# Patient Record
Sex: Female | Born: 2000 | Race: Black or African American | Hispanic: No | Smoking: Never smoker
Health system: Southern US, Community
[De-identification: ages and names within clinical notes are randomized; demographics above are authoritative.]

---
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
## 2021-01-08 ENCOUNTER — Other Ambulatory Visit: Payer: Self-pay

## 2021-01-08 ENCOUNTER — Encounter (HOSPITAL_BASED_OUTPATIENT_CLINIC_OR_DEPARTMENT_OTHER): Payer: Self-pay

## 2021-01-08 ENCOUNTER — Emergency Department (HOSPITAL_BASED_OUTPATIENT_CLINIC_OR_DEPARTMENT_OTHER)
Admission: EM | Admit: 2021-01-08 | Discharge: 2021-01-08 | Disposition: A | Discharge status: Home / Self Care | Payer: PRIVATE HEALTH INSURANCE | Attending: Emergency Medicine | Admitting: Emergency Medicine

## 2021-01-08 DIAGNOSIS — R112 Nausea with vomiting, unspecified: Secondary | ICD-10-CM

## 2021-01-08 DIAGNOSIS — R1084 Generalized abdominal pain: Secondary | ICD-10-CM | POA: Diagnosis present

## 2021-01-08 DIAGNOSIS — R1110 Vomiting, unspecified: Secondary | ICD-10-CM | POA: Insufficient documentation

## 2021-01-08 LAB — CBC WITH DIFFERENTIAL/PLATELET
Abs Immature Granulocytes: 0.05 10*3/uL (ref 0.00–0.07)
Basophils Absolute: 0 10*3/uL (ref 0.0–0.1)
Basophils Relative: 0 %
Eosinophils Absolute: 0 10*3/uL (ref 0.0–0.5)
Eosinophils Relative: 0 %
HCT: 44.6 % (ref 36.0–46.0)
Hemoglobin: 14.5 g/dL (ref 12.0–15.0)
Immature Granulocytes: 0 %
Lymphocytes Relative: 11 %
Lymphs Abs: 1.4 10*3/uL (ref 0.7–4.0)
MCH: 28.2 pg (ref 26.0–34.0)
MCHC: 32.5 g/dL (ref 30.0–36.0)
MCV: 86.6 fL (ref 80.0–100.0)
Monocytes Absolute: 0.2 10*3/uL (ref 0.1–1.0)
Monocytes Relative: 2 %
Neutro Abs: 10.7 10*3/uL — ABNORMAL HIGH (ref 1.7–7.7)
Neutrophils Relative %: 87 %
Platelets: 333 10*3/uL (ref 150–400)
RBC: 5.15 MIL/uL — ABNORMAL HIGH (ref 3.87–5.11)
RDW: 12.5 % (ref 11.5–15.5)
WBC: 12.4 10*3/uL — ABNORMAL HIGH (ref 4.0–10.5)
nRBC: 0 % (ref 0.0–0.2)

## 2021-01-08 LAB — COMPREHENSIVE METABOLIC PANEL
ALT: 24 U/L (ref 0–44)
AST: 38 U/L (ref 15–41)
Albumin: 4.6 g/dL (ref 3.5–5.0)
Alkaline Phosphatase: 56 U/L (ref 38–126)
Anion gap: 15 (ref 5–15)
BUN: 13 mg/dL (ref 6–20)
CO2: 19 mmol/L — ABNORMAL LOW (ref 22–32)
Calcium: 9.6 mg/dL (ref 8.9–10.3)
Chloride: 106 mmol/L (ref 98–111)
Creatinine, Ser: 0.8 mg/dL (ref 0.44–1.00)
GFR, Estimated: >60 mL/min (ref >60)
Glucose, Bld: 109 mg/dL — ABNORMAL HIGH (ref 70–99)
Potassium: 3.9 mmol/L (ref 3.5–5.1)
Sodium: 140 mmol/L (ref 135–145)
Total Bilirubin: 0.7 mg/dL (ref 0.3–1.2)
Total Protein: 8.5 g/dL — ABNORMAL HIGH (ref 6.5–8.1)

## 2021-01-08 LAB — URINALYSIS, MICROSCOPIC (REFLEX): WBC, UA: NONE SEEN WBC/hpf (ref 0–5)

## 2021-01-08 LAB — PREGNANCY, URINE: Preg Test, Ur: NEGATIVE

## 2021-01-08 LAB — URINALYSIS, ROUTINE W REFLEX MICROSCOPIC
Bilirubin Urine: NEGATIVE
Glucose, UA: NEGATIVE mg/dL
Hgb urine dipstick: NEGATIVE
Ketones, ur: 40 mg/dL — AB
Leukocytes,Ua: NEGATIVE
Nitrite: NEGATIVE
Protein, ur: 100 mg/dL — AB
Specific Gravity, Urine: >1.03 — ABNORMAL HIGH (ref 1.005–1.030)
pH: 6.5 (ref 5.0–8.0)

## 2021-01-08 LAB — LIPASE, BLOOD: Lipase: 30 U/L (ref 11–51)

## 2021-01-08 MED ORDER — ONDANSETRON HCL 4 MG PO TABS: 4.0000 mg | ORAL_TABLET | Freq: Four times a day (QID) | ORAL | 0 refills | Status: DC

## 2021-01-08 MED ORDER — ONDANSETRON HCL 4 MG/2ML IJ SOLN
4.0000 mg | Freq: Once | INTRAMUSCULAR | Status: AC
  Administered 2021-01-08: 4 mg via INTRAVENOUS
  Filled 2021-01-08: qty 2

## 2021-01-08 MED ORDER — DICYCLOMINE HCL 10 MG/ML IM SOLN
20.0000 mg | Freq: Once | INTRAMUSCULAR | Status: AC
  Administered 2021-01-08: 20 mg via INTRAMUSCULAR
  Filled 2021-01-08: qty 2

## 2021-01-08 MED ORDER — SODIUM CHLORIDE 0.9 % IV BOLUS
1000.0000 mL | Freq: Once | INTRAVENOUS | Status: AC
  Administered 2021-01-08: 1000 mL via INTRAVENOUS

## 2021-01-08 NOTE — ED Notes (Signed)
D/c paperwork reviewed with pt, including prescription.  All questions addressed prior to d/c. Pt accompanied by visitor to ED exit. NAD.

## 2021-01-08 NOTE — Discharge Instructions (Addendum)
Take Zofran every 6 hours as needed for nausea and vomiting.  You take Tylenol Motrin for abdominal pain.  Follow-up with your primary care doctor if you are still having symptoms in a week.  I suspect you are just dehydrated from the alcohol, I would advise abstaining from alcohol.

## 2021-01-08 NOTE — ED Triage Notes (Addendum)
Pt arrives with c/o vomiting and central abdominal pain starting this morning. No other symptoms.   After triage pt fiance reports she drank 6-7 shots last night.

## 2021-01-08 NOTE — ED Notes (Signed)
IV attempt x2 unsuccessful, RN aware.

## 2021-01-08 NOTE — ED Provider Notes (Signed)
[facility] EMERGENCY DEPARTMENT Provider Note   CSN: [id number] Arrival date & time: 01/08/21  1510     History Chief Complaint  Patient presents with   Abdominal Pain   Emesis    Judy Daniel is a 20 y.o. female.   Abdominal Pain Associated symptoms: diarrhea and vomiting   Associated symptoms: no chest pain, no dysuria, no fever and no shortness of breath   Emesis Associated symptoms: abdominal pain and diarrhea   Associated symptoms: no fever    Patient presents with epigastric pain and emesis.  She went out drinking last night and had 6 or 7 shots, woke up this morning having diffuse abdominal pain which is worse in the epigastric area.  Its been constant, aggravated by vomiting.  Denies any hematemesis, not having any dysuria or hematuria.  Denies any previous abdominal surgeries.  Has not tried any over-the-counter medicines.   History reviewed. No pertinent past medical history.  There are no problems to display for this patient.   History reviewed. No pertinent surgical history.   OB History   No obstetric history on file.     No family history on file.  Social History   Tobacco Use   Smoking status: Never   Smokeless tobacco: Never  Vaping Use   Vaping Use: Every day  Substance Use Topics   Alcohol use: Yes    Comment: pt reports drinking 6-7 shots last night   Drug use: Yes    Types: Marijuana    Comment: daily    Home Medications Prior to Admission medications   Medication Sig Start Date End Date Taking? Authorizing Provider  COVID-19 Ad26 vaccine, JANSSEN/J&J, 0.5 ML injection Inject into the muscle. 11/10/20   Judyann Munson, MD    Allergies    Patient has no known allergies.  Review of Systems   Review of Systems  Constitutional:  Negative for fever.  Respiratory:  Negative for shortness of breath.   Cardiovascular:  Negative for chest pain.  Gastrointestinal:  Positive for abdominal pain, diarrhea and  vomiting.  Genitourinary:  Negative for dysuria and flank pain.  Musculoskeletal:  Negative for back pain.   Physical Exam Updated Vital Signs BP 105/65 (BP Location: Left Arm)   Pulse (!) 110   Temp 97.6 F (36.4 C) (Oral)   Resp 15   Ht 5\' 2"  (1.575 m)   Wt 65.8 kg   LMP 12/08/2020   SpO2 100%   BMI 26.52 kg/m   Physical Exam Vitals and nursing note reviewed.  Constitutional:      General: She is not in acute distress.    Appearance: She is well-developed.  HENT:     Head: Normocephalic and atraumatic.  Eyes:     Conjunctiva/sclera: Conjunctivae normal.  Cardiovascular:     Rate and Rhythm: Normal rate and regular rhythm.     Heart sounds: No murmur heard. Pulmonary:     Effort: Pulmonary effort is normal. No respiratory distress.     Breath sounds: Normal breath sounds.  Abdominal:     Palpations: Abdomen is soft.     Tenderness: There is abdominal tenderness in the epigastric area. There is no guarding or rebound.  Musculoskeletal:        General: No swelling.     Cervical back: Neck supple.  Skin:    General: Skin is warm and dry.     Capillary Refill: Capillary refill takes less than 2 seconds.  Neurological:     Mental  Status: She is alert.  Psychiatric:        Mood and Affect: Mood normal.   ED Results / Procedures / Treatments   Labs (all labs ordered are listed, but only abnormal results are displayed) Labs Reviewed  CBC WITH DIFFERENTIAL/PLATELET - Abnormal; Notable for the following components:      Result Value   WBC 12.4 (*)    RBC 5.15 (*)    Neutro Abs 10.7 (*)    All other components within normal limits  PREGNANCY, URINE  COMPREHENSIVE METABOLIC PANEL  LIPASE, BLOOD  URINALYSIS, ROUTINE W REFLEX MICROSCOPIC    EKG None  Radiology No results found.  Procedures Procedures   Medications Ordered in ED Medications  sodium chloride 0.9 % bolus 1,000 mL (has no administration in time range)  ondansetron (ZOFRAN) injection 4 mg (has  no administration in time range)  dicyclomine (BENTYL) injection 20 mg (has no administration in time range)    ED Course  I have reviewed the triage vital signs and the nursing notes.  Pertinent labs & imaging results that were available during my care of the patient were reviewed by me and considered in my medical decision making (see chart for details).  Clinical Course as of 01/08/21 1644  Fri Jan 08, 2021  1616 Lipase, blood Lipase within normal limits, although she is having epigastric pain and nausea and vomiting suspect likely secondary to ketosis from from the alcohol/emesis and not pancreatitis. [HS]  [id number] CBC with Differential(!) And not slight leukocytosis likely reactionary, no anemia.  [HS]  [id number] Comprehensive metabolic panel(!) No gross electrolyte, AKI or elevated LFT pattern concerning for biliary process.  [HS]  [id number] Urine is negative for UTI, additionally she is not pregnant.  There is ketonuria and proteinuria consistent with her emesis. [HS]    Clinical Course User Index [HS] Sherrill Raring, PA-C   MDM Rules/Calculators/A&P                           Stable vitals, nontoxic-appearing.  Diffuse abdominal tenderness, not specifically well localized.  No peritoneal signs, doubt surgical abdomen.  Is a consideration this could be an early appendicitis, however given her presentation and history of recent alcohol intake I think that is less likely.  Lab work as a pertains to the differential is in the ED course.  Will give fluids, Bentyl, Zofran and discharge patient.  Advised to return if she starts having right lower quadrant pain.  Patient discharged in stable condition.  Final Clinical Impression(s) / ED Diagnoses Final diagnoses:  None    Rx / DC Orders ED Discharge Orders     None        Sherrill Raring, [place] 01/08/21 1828    Lucrezia Starch, MD 01/09/21 1919

## 2021-04-01 ENCOUNTER — Emergency Department (HOSPITAL_BASED_OUTPATIENT_CLINIC_OR_DEPARTMENT_OTHER)
Admission: EM | Admit: 2021-04-01 | Discharge: 2021-04-01 | Disposition: A | Discharge status: Home / Self Care | Payer: Medicaid Other | Attending: Emergency Medicine | Admitting: Emergency Medicine

## 2021-04-01 ENCOUNTER — Other Ambulatory Visit: Payer: Self-pay

## 2021-04-01 ENCOUNTER — Emergency Department (HOSPITAL_BASED_OUTPATIENT_CLINIC_OR_DEPARTMENT_OTHER): Payer: Medicaid Other

## 2021-04-01 ENCOUNTER — Encounter (HOSPITAL_BASED_OUTPATIENT_CLINIC_OR_DEPARTMENT_OTHER): Payer: Self-pay | Admitting: *Deleted

## 2021-04-01 DIAGNOSIS — R1084 Generalized abdominal pain: Secondary | ICD-10-CM | POA: Diagnosis present

## 2021-04-01 DIAGNOSIS — Z79899 Other long term (current) drug therapy: Secondary | ICD-10-CM | POA: Diagnosis not present

## 2021-04-01 DIAGNOSIS — N3090 Cystitis, unspecified without hematuria: Secondary | ICD-10-CM | POA: Diagnosis not present

## 2021-04-01 LAB — COMPREHENSIVE METABOLIC PANEL
ALT: 16 U/L (ref 0–44)
AST: 29 U/L (ref 15–41)
Albumin: 4.9 g/dL (ref 3.5–5.0)
Alkaline Phosphatase: 57 U/L (ref 38–126)
Anion gap: 15 (ref 5–15)
BUN: 11 mg/dL (ref 6–20)
CO2: 21 mmol/L — ABNORMAL LOW (ref 22–32)
Calcium: 10 mg/dL (ref 8.9–10.3)
Chloride: 101 mmol/L (ref 98–111)
Creatinine, Ser: 0.95 mg/dL (ref 0.44–1.00)
GFR, Estimated: >60 mL/min (ref >60)
Glucose, Bld: 116 mg/dL — ABNORMAL HIGH (ref 70–99)
Potassium: 4.1 mmol/L (ref 3.5–5.1)
Sodium: 137 mmol/L (ref 135–145)
Total Bilirubin: 0.9 mg/dL (ref 0.3–1.2)
Total Protein: 9 g/dL — ABNORMAL HIGH (ref 6.5–8.1)

## 2021-04-01 LAB — CBC
HCT: 46.4 % — ABNORMAL HIGH (ref 36.0–46.0)
Hemoglobin: 15.6 g/dL — ABNORMAL HIGH (ref 12.0–15.0)
MCH: 29.5 pg (ref 26.0–34.0)
MCHC: 33.6 g/dL (ref 30.0–36.0)
MCV: 87.7 fL (ref 80.0–100.0)
Platelets: 392 10*3/uL (ref 150–400)
RBC: 5.29 MIL/uL — ABNORMAL HIGH (ref 3.87–5.11)
RDW: 12.1 % (ref 11.5–15.5)
WBC: 7.3 10*3/uL (ref 4.0–10.5)
nRBC: 0 % (ref 0.0–0.2)

## 2021-04-01 LAB — URINALYSIS, ROUTINE W REFLEX MICROSCOPIC
Bilirubin Urine: NEGATIVE
Glucose, UA: NEGATIVE mg/dL
Ketones, ur: 80 mg/dL — AB
Nitrite: NEGATIVE
Protein, ur: 30 mg/dL — AB
Specific Gravity, Urine: 1.02 (ref 1.005–1.030)
pH: 8.5 — ABNORMAL HIGH (ref 5.0–8.0)

## 2021-04-01 LAB — URINALYSIS, MICROSCOPIC (REFLEX): WBC, UA: >50 WBC/hpf (ref 0–5)

## 2021-04-01 LAB — WET PREP, GENITAL
Clue Cells Wet Prep HPF POC: NONE SEEN
Sperm: NONE SEEN
Trich, Wet Prep: NONE SEEN
WBC, Wet Prep HPF POC: >=10 — AB (ref <10)
Yeast Wet Prep HPF POC: NONE SEEN

## 2021-04-01 LAB — PREGNANCY, URINE: Preg Test, Ur: NEGATIVE

## 2021-04-01 LAB — LIPASE, BLOOD: Lipase: 39 U/L (ref 11–51)

## 2021-04-01 MED ORDER — CEPHALEXIN 500 MG PO CAPS: 500.0000 mg | ORAL_CAPSULE | Freq: Two times a day (BID) | ORAL | 0 refills | Status: AC

## 2021-04-01 MED ORDER — DICYCLOMINE HCL 10 MG/ML IM SOLN
20.0000 mg | Freq: Once | INTRAMUSCULAR | Status: AC
  Administered 2021-04-01: 20 mg via INTRAMUSCULAR
  Filled 2021-04-01: qty 2

## 2021-04-01 MED ORDER — ONDANSETRON HCL 8 MG PO TABS: 4.0000 mg | ORAL_TABLET | Freq: Three times a day (TID) | ORAL | 0 refills | Status: DC | PRN

## 2021-04-01 MED ORDER — HYDROMORPHONE HCL 1 MG/ML IJ SOLN
0.5000 mg | Freq: Once | INTRAMUSCULAR | Status: AC
  Administered 2021-04-01: 0.5 mg via INTRAVENOUS
  Filled 2021-04-01: qty 1

## 2021-04-01 MED ORDER — LACTATED RINGERS IV BOLUS
1000.0000 mL | Freq: Once | INTRAVENOUS | Status: AC
  Administered 2021-04-01: 1000 mL via INTRAVENOUS

## 2021-04-01 MED ORDER — IOHEXOL 300 MG/ML  SOLN
100.0000 mL | Freq: Once | INTRAMUSCULAR | Status: AC | PRN
  Administered 2021-04-01: 100 mL via INTRAVENOUS

## 2021-04-01 MED ORDER — OMEPRAZOLE 10 MG PO CPDR: 10.0000 mg | DELAYED_RELEASE_CAPSULE | Freq: Two times a day (BID) | ORAL | 0 refills | Status: DC

## 2021-04-01 MED ORDER — ONDANSETRON HCL 4 MG/2ML IJ SOLN
4.0000 mg | Freq: Once | INTRAMUSCULAR | Status: AC
  Administered 2021-04-01: 4 mg via INTRAVENOUS
  Filled 2021-04-01: qty 2

## 2021-04-01 NOTE — Discharge Instructions (Addendum)
Please return to ED with any new symptoms such as ongoing abdominal pain, vomiting blood, blood in stool ?Please call the [facility] in [place] and request an appointment.  The number is attached in this document. ?Please follow-up with your PCP in the next 7 to 10 days ?Please begin taking omeprazole, this is a proton pump inhibitor, it helps to decrease the acidity of your stomach acid.  It will help with any abdominal pain in the future.  You will take this medication twice daily for 30 days. ?I have also sent in antinausea medication.  It is called Zofran.  You may take this medication every 8 hours as needed for nausea and vomiting. ?Please begin taking your antibiotic for your UTI.  You will take this twice daily for the next 5 days. ?Please sign up for MyChart so you can follow-up on the results of your STD screen ?Please begin utilizing condoms when you are having sexual activity ?

## 2021-04-01 NOTE — ED Notes (Signed)
Called lab to add on urine culture @ 1609.  ?

## 2021-04-01 NOTE — ED Triage Notes (Addendum)
Vomiting since this am. Abdominal pain. Pain in her chest with breathing. She is hyperventilating at triage. States she feels like she might have a UTI. ?

## 2021-04-01 NOTE — ED Provider Notes (Signed)
[facility] EMERGENCY DEPARTMENT Provider Note   CSN: [id number] Arrival date & time: 04/01/21  1315     History  Chief Complaint  Patient presents with   Abdominal Pain    Judy Daniel is a 21 y.o. female with no provided medical history. Patient reports to ED for evaluation of abdominal pain. Patient states that yesterday she vomited x1, and has thrown up "all day" today since 7AM, states it is more than 10x. Patient reports there is blood in her vomit. Patient reports the pain is located in her upper abdomen and the pain is constant. She states the pain feels "cramping, stabbing". Patient denies taking any medication prior to arrival. Patient endorsing nausea, vomiting, blood in vomit x4, diarrhea, chills, sweats. Patient denies fevers. Patient states she started her period on Saturday.    Abdominal Pain Associated symptoms: chills, diarrhea, nausea, vaginal bleeding, vaginal discharge and vomiting (Blood in vomit x4)   Associated symptoms: no dysuria and no fever       Home Medications Prior to Admission medications   Medication Sig Start Date End Date Taking? Authorizing Provider  cephALEXin (KEFLEX) 500 MG capsule Take 1 capsule (500 mg total) by mouth 2 (two) times daily for 5 days. 04/01/21 04/06/21 Yes Al Decant, PA-C  omeprazole (PRILOSEC) 10 MG capsule Take 1 capsule (10 mg total) by mouth in the morning and at bedtime. 04/01/21 05/01/21 Yes Al Decant, PA-C  ondansetron (ZOFRAN) 8 MG tablet Take 0.5 tablets (4 mg total) by mouth every 8 (eight) hours as needed for nausea or vomiting. 04/01/21  Yes Delice Bison F, PA-C  COVID-19 Ad26 vaccine, JANSSEN/J&J, 0.5 ML injection Inject into the muscle. 11/10/20   Judyann Munson, MD      Allergies    Sulfanilamide    Review of Systems   Review of Systems  Constitutional:  Positive for chills. Negative for fever.  Gastrointestinal:  Positive for abdominal pain, diarrhea, nausea and  vomiting (Blood in vomit x4).  Genitourinary:  Positive for vaginal bleeding and vaginal discharge. Negative for dysuria and flank pain.  All other systems reviewed and are negative.  Physical Exam Updated Vital Signs BP 113/73    Pulse (!) 55    Temp 98.5 F (36.9 C) (Oral)    Resp 18    Ht 5\' 2"  (1.575 m)    Wt 65.8 kg    LMP 03/27/2021    SpO2 100%    BMI 26.53 kg/m  Physical Exam Vitals and nursing note reviewed. Exam conducted with a chaperone present.  Constitutional:      General: She is not in acute distress.    Appearance: She is not ill-appearing, toxic-appearing or diaphoretic.  HENT:     Head: Normocephalic and atraumatic.     Mouth/Throat:     Mouth: Mucous membranes are moist.  Eyes:     Extraocular Movements: Extraocular movements intact.     Pupils: Pupils are equal, round, and reactive to light.  Cardiovascular:     Rate and Rhythm: Normal rate and regular rhythm.  Pulmonary:     Effort: Pulmonary effort is normal.     Breath sounds: Normal breath sounds. No wheezing.  Abdominal:     General: Abdomen is flat. Bowel sounds are normal. There is no distension. There are no signs of injury.     Palpations: Abdomen is soft. There is no hepatomegaly, splenomegaly, mass or pulsatile mass.     Tenderness: There is generalized abdominal tenderness. There  is no right CVA tenderness or left CVA tenderness.  Genitourinary:    Exam position: Knee-chest position.     Pubic Area: No rash or pubic lice.      Tanner stage (genital): 5.     Labia:        Right: No rash or tenderness.        Left: No rash or tenderness.      Vagina: No signs of injury and foreign body. Vaginal discharge and bleeding present. No erythema, tenderness or prolapsed vaginal walls.     Cervix: No friability or erythema.     Uterus: Normal. Not tender.      Adnexa: Right adnexa normal and left adnexa normal.       Right: No tenderness.         Left: No tenderness.    Skin:    General: Skin is warm  and dry.     Capillary Refill: Capillary refill takes less than 2 seconds.  Neurological:     Mental Status: She is alert and oriented to person, place, and time.    ED Results / Procedures / Treatments   Labs (all labs ordered are listed, but only abnormal results are displayed) Labs Reviewed  WET PREP, GENITAL - Abnormal; Notable for the following components:      Result Value   WBC, Wet Prep HPF POC >=10 (*)    All other components within normal limits  COMPREHENSIVE METABOLIC PANEL - Abnormal; Notable for the following components:   CO2 21 (*)    Glucose, Bld 116 (*)    Total Protein 9.0 (*)    All other components within normal limits  CBC - Abnormal; Notable for the following components:   RBC 5.29 (*)    Hemoglobin 15.6 (*)    HCT 46.4 (*)    All other components within normal limits  URINALYSIS, ROUTINE W REFLEX MICROSCOPIC - Abnormal; Notable for the following components:   APPearance CLOUDY (*)    pH 8.5 (*)    Hgb urine dipstick SMALL (*)    Ketones, ur 80 (*)    Protein, ur 30 (*)    Leukocytes,Ua SMALL (*)    All other components within normal limits  URINALYSIS, MICROSCOPIC (REFLEX) - Abnormal; Notable for the following components:   Bacteria, UA MANY (*)    All other components within normal limits  URINE CULTURE  LIPASE, BLOOD  PREGNANCY, URINE  RPR  HIV ANTIBODY (ROUTINE TESTING W REFLEX)  GC/CHLAMYDIA PROBE AMP ([place]) NOT AT [facility]    EKG None  Radiology CT ABDOMEN PELVIS W CONTRAST  Result Date: 04/01/2021 CLINICAL DATA:  Abdominal pain, acute, nonlocalized EXAM: CT ABDOMEN AND PELVIS WITH CONTRAST TECHNIQUE: Multidetector CT imaging of the abdomen and pelvis was performed using the standard protocol following bolus administration of intravenous contrast. RADIATION DOSE REDUCTION: This exam was performed according to the departmental dose-optimization program which includes automated exposure control, adjustment of the mA and/or kV according to  patient size and/or use of iterative reconstruction technique. CONTRAST:  100mL OMNIPAQUE IOHEXOL 300 MG/ML  SOLN COMPARISON:  None. FINDINGS: Lower chest: No acute abnormality. Hepatobiliary: Hypodensity along the false form ligament likely represents focal fatty infiltration. No focal liver abnormality. No gallstones, gallbladder wall thickening, or pericholecystic fluid. No biliary dilatation. Pancreas: No focal lesion. Normal pancreatic contour. No surrounding inflammatory changes. No main pancreatic ductal dilatation. Normal in size without focal abnormality. Spleen: Normal in size without focal abnormality. Adrenals/Urinary Tract: No adrenal  nodule bilaterally. Bilateral kidneys enhance symmetrically. No hydronephrosis. No hydroureter. The urinary bladder is unremarkable. Stomach/Bowel: Stomach is within normal limits. No evidence of bowel wall thickening or dilatation. The appendix is not definitely identified with no inflammatory changes in the right lower quadrant to suggest acute appendicitis. Vascular/Lymphatic: No abdominal aorta or iliac aneurysm. No abdominal, pelvic, or inguinal lymphadenopathy. Reproductive: Uterus and bilateral ovaries/adnexa are unremarkable. Other: Trace free fluid with the pelvis. No intraperitoneal free gas. No organized fluid collection. Musculoskeletal: No abdominal wall hernia or abnormality. No suspicious lytic or blastic osseous lesions. No acute displaced fracture. IMPRESSION: No acute intra-abdominal or intrapelvic abnormality. Electronically Signed   By: Tish Frederickson M.D.   On: 04/01/2021 16:31    Procedures Pelvic exam  Date/Time: 04/01/2021 5:17 PM Performed by: Al Decant, PA-C Authorized by: Al Decant, PA-C  Consent: Verbal consent obtained. Written consent obtained. Risks and benefits: risks, benefits and alternatives were discussed Consent given by: patient Patient understanding: patient states understanding of the procedure being  performed Patient consent: the patient's understanding of the procedure matches consent given Patient identity confirmed: verbally with patient and arm band Time out: Immediately prior to procedure a "time out" was called to verify the correct patient, procedure, equipment, support staff and site/side marked as required. Preparation: Patient was prepped and draped in the usual sterile fashion. Local anesthesia used: no  Anesthesia: Local anesthesia used: no  Sedation: Patient sedated: no  Patient tolerance: patient tolerated the procedure well with no immediate complications      Medications Ordered in ED Medications  ondansetron (ZOFRAN) injection 4 mg (4 mg Intravenous Given 04/01/21 1458)  lactated ringers bolus 1,000 mL (0 mLs Intravenous Stopped 04/01/21 1630)  dicyclomine (BENTYL) injection 20 mg (20 mg Intramuscular Given 04/01/21 1452)  HYDROmorphone (DILAUDID) injection 0.5 mg (0.5 mg Intravenous Given 04/01/21 1629)  iohexol (OMNIPAQUE) 300 MG/ML solution 100 mL (100 mLs Intravenous Contrast Given 04/01/21 1607)    ED Course/ Medical Decision Making/ A&P                           Medical Decision Making Amount and/or Complexity of Data Reviewed Labs: ordered. Radiology: ordered.  Risk Prescription drug management.   21 year old female presents to ED for evaluation of acute onset of abdominal pain last night/this morning with associated nausea and vomiting.  Patient also reporting blood in vomit.  On examination, the patient is obviously uncomfortable.  She is unable to stay still for throughout the duration of my exam.  She is continuously writhing on the bed.    The patient is afebrile, nontachycardic, not hypoxic.  The patient has clear lung sounds bilaterally.  The patient's abdominal exam does not raise concern for acute abdomen.  Her abdomen is soft and compressible in all 4 quadrants.  She is endorsing generalized abdominal tenderness on palpation however.   The  patient adds at the end of my examination that prior to starting her period she experienced an episode of vaginal discharge that she described as clear.  Due to this, the patient is requesting STD screenings to include gonorrhea, chlamydia, wet prep, HIV, syphilis screen.  Patient states that she is in monogamous relationship with a partner known to her.  She denies any pelvic pain, fevers.  She does endorse unprotected sex recently.  Patient denies that her partner is experiencing any symptoms either.  Please see pelvic exam procedure note.  The patient we worked up utilizing  the following labs and imaging studies interpreted by me: - CMP unremarkable - Lipase unremarkable - Pregnancy test negative - CBC unremarkable, no elevated white blood cell count - Urinalysis shows small leukocytes, ketones, protein, elevated pH of urine.  Patient states that when she urinates she feels as if her vagina "throbs" at the end of her urinating.  She is not sure if it burns when she pees.  Out of an abundance of caution, I have placed this patient on Keflex twice daily for 5 days in the event of UTI.  I have sent the patient's urine out for culture. -CT abdomen pelvis with contrast was ordered due to the fact that after administration of analgesic medication of this patient she continues to have abdominal pain causing her writhing pain.  CT abdomen pelvis with contrast does not show any abnormality.  Patient treated utilizing 1000 mL bolus of lactated Ringer's, 0.5 mg Dilaudid, 4 mg Zofran, 20 mg Bentyl.  I consulted with GI due to the fact the patient stated that she was having episodes of vomiting with blood mixed in.  She stated that she had vomited 10 times which caused concern for Mallory-Weiss tear.  I consulted with Dorethea Clan of gastroenterology who stated that the patient did not have an elevated BUN or decreased hemoglobin.  Shanda Bumps stated that if the patient did not vomit while in the department she  will be suitable to treat as an outpatient utilizing 2 time daily PPI along with Carafate.  I have placed the patient on these medications as directed by gastroenterology.  At this time, patient states that she feels much better.  This patient seems stable for discharge at this time.  I have referred the patient to a gynecologist that she states that she has never had one.  I provided the patient with return precautions.  I have answered all the patient's questions to her satisfaction.  Patient is stable at this time for discharge.   Final Clinical Impression(s) / ED Diagnoses Final diagnoses:  Generalized abdominal pain  Cystitis    Rx / DC Orders ED Discharge Orders          Ordered    omeprazole (PRILOSEC) 10 MG capsule  2 times daily        04/01/21 1727    ondansetron (ZOFRAN) 8 MG tablet  Every 8 hours PRN        04/01/21 1727    cephALEXin (KEFLEX) 500 MG capsule  2 times daily        04/01/21 1727              Al Decant, PA-C 04/01/21 1739    Vanetta Mulders, MD 04/03/21 Rickey Primus

## 2021-04-02 ENCOUNTER — Emergency Department (HOSPITAL_BASED_OUTPATIENT_CLINIC_OR_DEPARTMENT_OTHER)
Admission: EM | Admit: 2021-04-02 | Discharge: 2021-04-02 | Disposition: A | Discharge status: Home / Self Care | Payer: Medicaid Other | Attending: Emergency Medicine | Admitting: Emergency Medicine

## 2021-04-02 ENCOUNTER — Other Ambulatory Visit: Payer: Self-pay

## 2021-04-02 ENCOUNTER — Encounter (HOSPITAL_BASED_OUTPATIENT_CLINIC_OR_DEPARTMENT_OTHER): Payer: Self-pay | Admitting: Emergency Medicine

## 2021-04-02 DIAGNOSIS — R109 Unspecified abdominal pain: Secondary | ICD-10-CM | POA: Diagnosis not present

## 2021-04-02 DIAGNOSIS — F1290 Cannabis use, unspecified, uncomplicated: Secondary | ICD-10-CM | POA: Diagnosis not present

## 2021-04-02 DIAGNOSIS — E860 Dehydration: Secondary | ICD-10-CM | POA: Diagnosis not present

## 2021-04-02 DIAGNOSIS — E876 Hypokalemia: Secondary | ICD-10-CM | POA: Diagnosis not present

## 2021-04-02 DIAGNOSIS — R112 Nausea with vomiting, unspecified: Secondary | ICD-10-CM

## 2021-04-02 LAB — CBC WITH DIFFERENTIAL/PLATELET
Abs Immature Granulocytes: 0.03 10*3/uL (ref 0.00–0.07)
Basophils Absolute: 0 10*3/uL (ref 0.0–0.1)
Basophils Relative: 0 %
Eosinophils Absolute: 0 10*3/uL (ref 0.0–0.5)
Eosinophils Relative: 0 %
HCT: 43.3 % (ref 36.0–46.0)
Hemoglobin: 14.7 g/dL (ref 12.0–15.0)
Immature Granulocytes: 0 %
Lymphocytes Relative: 13 %
Lymphs Abs: 1.4 10*3/uL (ref 0.7–4.0)
MCH: 29.6 pg (ref 26.0–34.0)
MCHC: 33.9 g/dL (ref 30.0–36.0)
MCV: 87.1 fL (ref 80.0–100.0)
Monocytes Absolute: 0.6 10*3/uL (ref 0.1–1.0)
Monocytes Relative: 5 %
Neutro Abs: 9 10*3/uL — ABNORMAL HIGH (ref 1.7–7.7)
Neutrophils Relative %: 82 %
Platelets: 380 10*3/uL (ref 150–400)
RBC: 4.97 MIL/uL (ref 3.87–5.11)
RDW: 12.4 % (ref 11.5–15.5)
WBC: 11.1 10*3/uL — ABNORMAL HIGH (ref 4.0–10.5)
nRBC: 0 % (ref 0.0–0.2)

## 2021-04-02 LAB — URINALYSIS, MICROSCOPIC (REFLEX)
RBC / HPF: >50 RBC/hpf (ref 0–5)
WBC, UA: >50 WBC/hpf (ref 0–5)

## 2021-04-02 LAB — URINALYSIS, ROUTINE W REFLEX MICROSCOPIC
Glucose, UA: NEGATIVE mg/dL
Ketones, ur: 80 mg/dL — AB
Nitrite: NEGATIVE
Protein, ur: 100 mg/dL — AB
Specific Gravity, Urine: >=1.03 (ref 1.005–1.030)
pH: 6.5 (ref 5.0–8.0)

## 2021-04-02 LAB — COMPREHENSIVE METABOLIC PANEL
ALT: 23 U/L (ref 0–44)
AST: 49 U/L — ABNORMAL HIGH (ref 15–41)
Albumin: 4.8 g/dL (ref 3.5–5.0)
Alkaline Phosphatase: 53 U/L (ref 38–126)
Anion gap: 15 (ref 5–15)
BUN: 10 mg/dL (ref 6–20)
CO2: 22 mmol/L (ref 22–32)
Calcium: 9.5 mg/dL (ref 8.9–10.3)
Chloride: 101 mmol/L (ref 98–111)
Creatinine, Ser: 1.11 mg/dL — ABNORMAL HIGH (ref 0.44–1.00)
GFR, Estimated: >60 mL/min (ref >60)
Glucose, Bld: 106 mg/dL — ABNORMAL HIGH (ref 70–99)
Potassium: 3.1 mmol/L — ABNORMAL LOW (ref 3.5–5.1)
Sodium: 138 mmol/L (ref 135–145)
Total Bilirubin: 0.9 mg/dL (ref 0.3–1.2)
Total Protein: 8.6 g/dL — ABNORMAL HIGH (ref 6.5–8.1)

## 2021-04-02 LAB — RAPID URINE DRUG SCREEN, HOSP PERFORMED
Amphetamines: NOT DETECTED
Barbiturates: NOT DETECTED
Benzodiazepines: NOT DETECTED
Cocaine: NOT DETECTED
Opiates: NOT DETECTED
Tetrahydrocannabinol: POSITIVE — AB

## 2021-04-02 LAB — LIPASE, BLOOD: Lipase: 44 U/L (ref 11–51)

## 2021-04-02 LAB — GC/CHLAMYDIA PROBE AMP (~~LOC~~) NOT AT ARMC
Chlamydia: NEGATIVE
Comment: NEGATIVE
Comment: NORMAL
Neisseria Gonorrhea: NEGATIVE

## 2021-04-02 LAB — HIV ANTIBODY (ROUTINE TESTING W REFLEX): HIV Screen 4th Generation wRfx: NONREACTIVE

## 2021-04-02 LAB — RPR: RPR Ser Ql: NONREACTIVE

## 2021-04-02 MED ORDER — KETOROLAC TROMETHAMINE 15 MG/ML IJ SOLN
15.0000 mg | Freq: Once | INTRAMUSCULAR | Status: AC
  Administered 2021-04-02: 15 mg via INTRAVENOUS
  Filled 2021-04-02: qty 1

## 2021-04-02 MED ORDER — POTASSIUM CHLORIDE CRYS ER 20 MEQ PO TBCR
40.0000 meq | EXTENDED_RELEASE_TABLET | Freq: Once | ORAL | Status: AC
  Administered 2021-04-02: 40 meq via ORAL
  Filled 2021-04-02: qty 2

## 2021-04-02 MED ORDER — ONDANSETRON HCL 4 MG/2ML IJ SOLN
4.0000 mg | Freq: Once | INTRAMUSCULAR | Status: AC
  Administered 2021-04-02: 4 mg via INTRAVENOUS
  Filled 2021-04-02: qty 2

## 2021-04-02 MED ORDER — HALOPERIDOL LACTATE 5 MG/ML IJ SOLN
5.0000 mg | Freq: Once | INTRAMUSCULAR | Status: AC
  Administered 2021-04-02: 5 mg via INTRAVENOUS
  Filled 2021-04-02: qty 1

## 2021-04-02 MED ORDER — SODIUM CHLORIDE 0.9 % IV BOLUS
1000.0000 mL | Freq: Once | INTRAVENOUS | Status: AC
  Administered 2021-04-02: 1000 mL via INTRAVENOUS

## 2021-04-02 MED ORDER — POTASSIUM CHLORIDE 10 MEQ/100ML IV SOLN
10.0000 meq | Freq: Once | INTRAVENOUS | Status: AC
  Administered 2021-04-02: 10 meq via INTRAVENOUS
  Filled 2021-04-02: qty 100

## 2021-04-02 NOTE — ED Triage Notes (Signed)
Pt arrives pov with c/o abdominal pain with n/v this am upon waking ?

## 2021-04-02 NOTE — ED Provider Notes (Signed)
[facility] EMERGENCY DEPARTMENT Provider Note   CSN: [id number] Arrival date & time: 04/02/21  0901     History  Chief Complaint  Patient presents with   Abdominal Pain    Javeah Dineke Brazill is a 21 y.o. female.  The history is provided by the patient and medical records. No language interpreter was used.   21 year old female who presents for evaluation of abdominal pain with associate nausea and vomiting.  For the past 3 days patient report having pain in abdomen with associated nausea and vomiting.  Patient states she has sharp stabbing pain throughout her abdomen that has been waxing waning.  She endorsed persistent nausea and vomiting sometimes with trace of blood due to persistent vomiting.  She endorsed occasional loose stools for the past week.  She denies having any runny nose sneezing or coughing denies any recent sick contact.  She does admits to regular marijuana use.  She denies alcohol use.  Last marijuana use was 2 days ago.  She was seen yesterday for her complaint, a thorough work-up was obtained at that time and she was receiving treatment which did provide some relief.  Her symptom has returned.  She denies any urinary symptoms.  Home Medications Prior to Admission medications   Medication Sig Start Date End Date Taking? Authorizing Provider  cephALEXin (KEFLEX) 500 MG capsule Take 1 capsule (500 mg total) by mouth 2 (two) times daily for 5 days. 04/01/21 04/06/21  Al Decant, PA-C  COVID-19 Ad26 vaccine, JANSSEN/J&J, 0.5 ML injection Inject into the muscle. 11/10/20   Judyann Munson, MD  omeprazole (PRILOSEC) 10 MG capsule Take 1 capsule (10 mg total) by mouth in the morning and at bedtime. 04/01/21 05/01/21  Al Decant, PA-C  ondansetron (ZOFRAN) 8 MG tablet Take 0.5 tablets (4 mg total) by mouth every 8 (eight) hours as needed for nausea or vomiting. 04/01/21   Al Decant, PA-C      Allergies    Sulfanilamide    Review of  Systems   Review of Systems  All other systems reviewed and are negative.  Physical Exam Updated Vital Signs BP 138/74 (BP Location: Left Arm)    Pulse (!) 52    Temp 98.2 F (36.8 C)    Resp 20    Ht 5\' 2"  (1.575 m)    Wt 58.6 kg    LMP 03/27/2021    SpO2 100%    BMI 23.61 kg/m  Physical Exam Vitals and nursing note reviewed.  Constitutional:      Appearance: She is well-developed.     Comments: Appears very uncomfortable, actively vomiting  HENT:     Head: Atraumatic.  Eyes:     Conjunctiva/sclera: Conjunctivae normal.  Cardiovascular:     Rate and Rhythm: Normal rate and regular rhythm.     Pulses: Normal pulses.     Heart sounds: Normal heart sounds.  Pulmonary:     Effort: Pulmonary effort is normal.  Abdominal:     Tenderness: There is abdominal tenderness (Diffuse abdominal tenderness without focal point tenderness.  Negative Murphy sign, no pain at McBurney's point).  Musculoskeletal:     Cervical back: Neck supple.  Skin:    Findings: No rash.  Neurological:     Mental Status: She is alert.  Psychiatric:        Mood and Affect: Mood normal.    ED Results / Procedures / Treatments   Labs (all labs ordered are listed, but only abnormal results  are displayed) Labs Reviewed - No data to display  EKG None  Date: 04/02/2021  Rate: 60  Rhythm: normal sinus rhythm  QRS Axis: normal  Intervals: normal  ST/T Wave abnormalities: normal  Conduction Disutrbances: none  Narrative Interpretation: normal QTc  Old EKG Reviewed: No significant changes noted    Radiology CT ABDOMEN PELVIS W CONTRAST  Result Date: 04/01/2021 CLINICAL DATA:  Abdominal pain, acute, nonlocalized EXAM: CT ABDOMEN AND PELVIS WITH CONTRAST TECHNIQUE: Multidetector CT imaging of the abdomen and pelvis was performed using the standard protocol following bolus administration of intravenous contrast. RADIATION DOSE REDUCTION: This exam was performed according to the departmental dose-optimization  program which includes automated exposure control, adjustment of the mA and/or kV according to patient size and/or use of iterative reconstruction technique. CONTRAST:  OMNIPAQUE IOHEXOL 300 MG/ML  SOLN COMPARISON:  None. FINDINGS: Lower chest: No acute abnormality. Hepatobiliary: Hypodensity along the false form ligament likely represents focal fatty infiltration. No focal liver abnormality. No gallstones, gallbladder wall thickening, or pericholecystic fluid. No biliary dilatation. Pancreas: No focal lesion. Normal pancreatic contour. No surrounding inflammatory changes. No main pancreatic ductal dilatation. Normal in size without focal abnormality. Spleen: Normal in size without focal abnormality. Adrenals/Urinary Tract: No adrenal nodule bilaterally. Bilateral kidneys enhance symmetrically. No hydronephrosis. No hydroureter. The urinary bladder is unremarkable. Stomach/Bowel: Stomach is within normal limits. No evidence of bowel wall thickening or dilatation. The appendix is not definitely identified with no inflammatory changes in the right lower quadrant to suggest acute appendicitis. Vascular/Lymphatic: No abdominal aorta or iliac aneurysm. No abdominal, pelvic, or inguinal lymphadenopathy. Reproductive: Uterus and bilateral ovaries/adnexa are unremarkable. Other: Trace free fluid with the pelvis. No intraperitoneal free gas. No organized fluid collection. Musculoskeletal: No abdominal wall hernia or abnormality. No suspicious lytic or blastic osseous lesions. No acute displaced fracture. IMPRESSION: No acute intra-abdominal or intrapelvic abnormality. Electronically Signed   By: Tish Frederickson M.D.   On: 04/01/2021 16:31    Procedures Procedures    Medications Ordered in ED Medications - No data to display  ED Course/ Medical Decision Making/ A&P                           Medical Decision Making Amount and/or Complexity of Data Reviewed Labs: ordered. ECG/medicine tests:  ordered.  Risk Prescription drug management.   BP 138/74 (BP Location: Left Arm)    Pulse (!) 52    Temp 98.2 F (36.8 C)    Resp 20    Ht 5\' 2"  (1.575 m)    Wt 58.6 kg    LMP 03/27/2021    SpO2 100%    BMI 23.61 kg/m   9:49 AM Patient here with recurrent abdominal discomfort with associated nausea and vomiting for the past several days.  Was seen in the ED yesterday for complaint.  I have viewed her previous note as well as labs and imaging and considered in my plan of care.  Recent abdomen pelvis CT scan was unremarkable.  Her symptom is suggestive of cannabinoid hyperemesis syndrome.  10:23 AM Labs was independently reviewed interpreted by me.  Mildly elevated white count of 11.1 likely stress margination.  UDS positive for marijuana, which is likely the source of her discomfort.  Repeat potassium is 3.1, she will need some supplementation.  Creatinine is 1.1 along with 80 ketones urine suggestive of dehydration, IV fluid given.  There are evidence of blood in her urinalysis however patient denies any  urinary symptoms and she is currently on menstruation.  Doubt UTI.  EKG shows normal sinus rhythm without concerning prolonged QT.  We will give Haldol for symptom control.  1:42 PM At this time patient felt better and tolerates p.o.  Strong encouragement to avoid marijuana use as it likely contribute to her symptoms.  Return precaution given.   This patient presents to the ED for concern of abd pain, this involves an extensive number of treatment options, and is a complaint that carries with it a high risk of complications and morbidity.  The differential diagnosis includes cannabinoid hyperemesis syndrome, gastritis, PUD, pancreatitis, colitis, cholecystitis, appendicitis, diverticulitis, urinary tract infection, kidney stone.  Co morbidities that complicate the patient evaluation daily marijuana use Additional history obtained:   External records from outside source obtained and  reviewed including ER notes from yesterday  Lab Tests:  I Ordered, and personally interpreted labs.  The pertinent results include:  as above  Imaging Studies ordered:   I independently visualized and interpreted imaging which showed no acute finding on abd/pelvis CT from yesterday I agree with the radiologist interpretation  Cardiac Monitoring:  The patient was maintained on a cardiac monitor.  I personally viewed and interpreted the cardiac monitored which showed an underlying rhythm of: NSR, normal QTc  Medicines ordered and prescription drug management:  I ordered medication including haldol  for cannabinoid hyperemesis syndrome Reevaluation of the patient after these medicines showed that the patient improved I have reviewed the patients home medicines and have made adjustments as needed  Test Considered: as above  Critical Interventions: IVF  Antiemetic  Haldol  Consultations Obtained:  I requested consultation with the attending, Dr. Denton Lank,  and discussed lab and imaging findings as well as pertinent plan - they recommend: sxs control  Problem List / ED Course: cannabinoid hyperemesis syndrome  Dehydration  Hypokalemia  Reevaluation:  After the interventions noted above, I reevaluated the patient and found that they have :improved  Social Determinants of Health: lack of PCP  Dispostion:  After consideration of the diagnostic results and the patients response to treatment, I feel that the patent would benefit from outpt f/u and avoidance of marijuana.         Final Clinical Impression(s) / ED Diagnoses Final diagnoses:  Cannabinoid hyperemesis syndrome  Dehydration  Hypokalemia    Rx / DC Orders ED Discharge Orders     None         Fayrene Helper, PA-C 04/02/21 1343    Cathren Laine, MD 04/02/21 [phone]

## 2021-04-02 NOTE — ED Notes (Signed)
Pt feeling more nauseous after PO fluids. Notified Greta Doom, PA ?

## 2021-04-02 NOTE — ED Notes (Signed)
Discharge instructions discussed with pt. Pt verbalized understanding. Pt stable and ambulatory. No signature pad available. 

## 2021-04-03 ENCOUNTER — Encounter (HOSPITAL_BASED_OUTPATIENT_CLINIC_OR_DEPARTMENT_OTHER): Payer: Self-pay

## 2021-04-03 ENCOUNTER — Other Ambulatory Visit: Payer: Self-pay

## 2021-04-03 ENCOUNTER — Emergency Department (HOSPITAL_BASED_OUTPATIENT_CLINIC_OR_DEPARTMENT_OTHER)
Admission: EM | Admit: 2021-04-03 | Discharge: 2021-04-03 | Disposition: A | Discharge status: Home / Self Care | Payer: Medicaid Other | Attending: Emergency Medicine | Admitting: Emergency Medicine

## 2021-04-03 ENCOUNTER — Emergency Department (HOSPITAL_BASED_OUTPATIENT_CLINIC_OR_DEPARTMENT_OTHER): Payer: Medicaid Other

## 2021-04-03 DIAGNOSIS — R112 Nausea with vomiting, unspecified: Secondary | ICD-10-CM | POA: Diagnosis not present

## 2021-04-03 DIAGNOSIS — R12 Heartburn: Secondary | ICD-10-CM | POA: Insufficient documentation

## 2021-04-03 DIAGNOSIS — D72829 Elevated white blood cell count, unspecified: Secondary | ICD-10-CM | POA: Insufficient documentation

## 2021-04-03 DIAGNOSIS — R1084 Generalized abdominal pain: Secondary | ICD-10-CM | POA: Diagnosis not present

## 2021-04-03 DIAGNOSIS — R1011 Right upper quadrant pain: Secondary | ICD-10-CM

## 2021-04-03 LAB — CBC WITH DIFFERENTIAL/PLATELET
Abs Immature Granulocytes: 0.03 10*3/uL (ref 0.00–0.07)
Basophils Absolute: 0 10*3/uL (ref 0.0–0.1)
Basophils Relative: 0 %
Eosinophils Absolute: 0 10*3/uL (ref 0.0–0.5)
Eosinophils Relative: 0 %
HCT: 43.5 % (ref 36.0–46.0)
Hemoglobin: 14.3 g/dL (ref 12.0–15.0)
Immature Granulocytes: 0 %
Lymphocytes Relative: 18 %
Lymphs Abs: 2.1 10*3/uL (ref 0.7–4.0)
MCH: 28.7 pg (ref 26.0–34.0)
MCHC: 32.9 g/dL (ref 30.0–36.0)
MCV: 87.2 fL (ref 80.0–100.0)
Monocytes Absolute: 0.7 10*3/uL (ref 0.1–1.0)
Monocytes Relative: 6 %
Neutro Abs: 9.2 10*3/uL — ABNORMAL HIGH (ref 1.7–7.7)
Neutrophils Relative %: 76 %
Platelets: 359 10*3/uL (ref 150–400)
RBC: 4.99 MIL/uL (ref 3.87–5.11)
RDW: 12.2 % (ref 11.5–15.5)
WBC: 12 10*3/uL — ABNORMAL HIGH (ref 4.0–10.5)
nRBC: 0 % (ref 0.0–0.2)

## 2021-04-03 LAB — URINALYSIS, MICROSCOPIC (REFLEX)

## 2021-04-03 LAB — COMPREHENSIVE METABOLIC PANEL
ALT: 23 U/L (ref 0–44)
AST: 28 U/L (ref 15–41)
Albumin: 4.7 g/dL (ref 3.5–5.0)
Alkaline Phosphatase: 50 U/L (ref 38–126)
Anion gap: 12 (ref 5–15)
BUN: 11 mg/dL (ref 6–20)
CO2: 24 mmol/L (ref 22–32)
Calcium: 9.2 mg/dL (ref 8.9–10.3)
Chloride: 99 mmol/L (ref 98–111)
Creatinine, Ser: 0.95 mg/dL (ref 0.44–1.00)
GFR, Estimated: >60 mL/min (ref >60)
Glucose, Bld: 89 mg/dL (ref 70–99)
Potassium: 3.5 mmol/L (ref 3.5–5.1)
Sodium: 135 mmol/L (ref 135–145)
Total Bilirubin: 1 mg/dL (ref 0.3–1.2)
Total Protein: 8.6 g/dL — ABNORMAL HIGH (ref 6.5–8.1)

## 2021-04-03 LAB — URINALYSIS, ROUTINE W REFLEX MICROSCOPIC
Bilirubin Urine: NEGATIVE
Glucose, UA: NEGATIVE mg/dL
Ketones, ur: 80 mg/dL — AB
Nitrite: NEGATIVE
Protein, ur: NEGATIVE mg/dL
Specific Gravity, Urine: 1.025 (ref 1.005–1.030)
pH: 6 (ref 5.0–8.0)

## 2021-04-03 LAB — URINE CULTURE: Culture: >=100000 — AB

## 2021-04-03 LAB — PREGNANCY, URINE: Preg Test, Ur: NEGATIVE

## 2021-04-03 LAB — LIPASE, BLOOD: Lipase: 42 U/L (ref 11–51)

## 2021-04-03 LAB — MAGNESIUM: Magnesium: 2 mg/dL (ref 1.7–2.4)

## 2021-04-03 MED ORDER — LACTATED RINGERS IV BOLUS
1000.0000 mL | Freq: Once | INTRAVENOUS | Status: AC
  Administered 2021-04-03: 1000 mL via INTRAVENOUS

## 2021-04-03 MED ORDER — LIDOCAINE VISCOUS HCL 2 % MT SOLN
15.0000 mL | Freq: Once | OROMUCOSAL | Status: AC
  Administered 2021-04-03: 15 mL via ORAL
  Filled 2021-04-03: qty 15

## 2021-04-03 MED ORDER — DIPHENHYDRAMINE HCL 50 MG/ML IJ SOLN
25.0000 mg | Freq: Once | INTRAMUSCULAR | Status: AC
  Administered 2021-04-03: 25 mg via INTRAVENOUS
  Filled 2021-04-03: qty 1

## 2021-04-03 MED ORDER — DROPERIDOL 2.5 MG/ML IJ SOLN
1.2500 mg | Freq: Once | INTRAMUSCULAR | Status: AC
  Administered 2021-04-03: 1.25 mg via INTRAVENOUS
  Filled 2021-04-03: qty 2

## 2021-04-03 MED ORDER — OMEPRAZOLE 10 MG PO CPDR: 10.0000 mg | DELAYED_RELEASE_CAPSULE | Freq: Two times a day (BID) | ORAL | 0 refills | Status: DC

## 2021-04-03 MED ORDER — PROMETHAZINE HCL 25 MG PO TABS
25.0000 mg | ORAL_TABLET | Freq: Once | ORAL | Status: AC
  Administered 2021-04-03: 25 mg via ORAL
  Filled 2021-04-03: qty 1

## 2021-04-03 MED ORDER — ALUM & MAG HYDROXIDE-SIMETH 200-200-20 MG/5ML PO SUSP
30.0000 mL | Freq: Once | ORAL | Status: AC
  Administered 2021-04-03: 30 mL via ORAL
  Filled 2021-04-03: qty 30

## 2021-04-03 MED ORDER — ONDANSETRON HCL 4 MG PO TABS: 4.0000 mg | ORAL_TABLET | Freq: Three times a day (TID) | ORAL | 0 refills | Status: DC | PRN

## 2021-04-03 NOTE — ED Triage Notes (Signed)
Pt arrives with c/o continued abdominal pain states that she was recently seen here and told her pain was r/t smoking marijuana. Pt states she has not recently smoked any. Also states that she was unable to get her medications filled because of the cost.  ?

## 2021-04-03 NOTE — ED Notes (Addendum)
Pt requesting medication for heartburn. PA made aware. Pt well appearing and tolerating PO intake at this time.  ?

## 2021-04-03 NOTE — ED Notes (Signed)
With permission from patient, RN had conversation with sister over the phone regarding patients condition and care thus far. All questions/concerns addressed at this time.  ?

## 2021-04-03 NOTE — Discharge Instructions (Addendum)
You were seen here today for evaluation of your nausea and vomiting. I have resent your prescriptions. It is important for you to take these medications to prevent these symptoms from returning. Please stop smoking marijuana as this is the likely source of your vomiting. You have had a normal ultrasound of your gallbladder and your lab work is improving. It is important to stay well hydrated and drink plenty of fluids, mainly water.  ? ?Contact a doctor if: ?Your symptoms get worse. ?You have new symptoms. ?You have a fever. ?You cannot drink fluids without vomiting. ?You feel like you may vomit for more than 2 days. ?You feel light-headed or dizzy. ?You have a headache. ?You have muscle cramps. ?You have a rash. ?You have pain while peeing. ?Get help right away if: ?You have pain in your chest, neck, arm, or jaw. ?You feel very weak or you faint. ?You vomit again and again. ?You have vomit that is bright red or looks like black coffee grounds. ?You have bloody or black poop (stools) or poop that looks like tar. ?You have a very bad headache, a stiff neck, or both. ?You have very bad pain, cramping, or bloating in your belly (abdomen). ?You have trouble breathing. ?You are breathing very quickly. ?Your heart is beating very quickly. ?Your skin feels cold and clammy. ?You feel confused. ?You have signs of losing too much water in your body, such as: ?Dark pee, very little pee, or no pee. ?Cracked lips. ?Dry mouth. ?Sunken eyes. ?Sleepiness. ?Weakness. ?

## 2021-04-03 NOTE — ED Notes (Signed)
Pt sleeping. No distress noted. VSS.  ?

## 2021-04-03 NOTE — ED Provider Notes (Signed)
[facility] EMERGENCY DEPARTMENT Provider Note   CSN: [id number] Arrival date & time: 04/03/21  1309     History Chief Complaint  Patient presents with   Abdominal Pain    Judy Daniel is a 21 y.o. female otherwise healthy presents emergency department for evaluation of nausea, vomiting, and abdominal pain for the past 3 days.  She is seen in the emergency department for the past 2 days for this problem and was given medication.  She reports she could not afford her medication so she did not pick it up.  She reports diffuse abdominal pain mainly in the epigastric region.  She reports she had some loose stools a week before the symptoms started.  She reports she is urinating at least 3 times a day, with her last urination at least twice today.  She denies any dysuria, hematuria, coffee-ground emesis, or any blood in her vomit.  She denies any chest pain or shortness of breath, but mention she does have some heartburn.  She reports she is able to belch and have flatulence.  She reports she was a daily marijuana smoker for over a year with her last use being 2 days ago.  She is allergic to sulfa.   Abdominal Pain Associated symptoms: nausea and vomiting   Associated symptoms: no chills, no fever and no sore throat       Home Medications Prior to Admission medications   Medication Sig Start Date End Date Taking? Authorizing Provider  cephALEXin (KEFLEX) 500 MG capsule Take 1 capsule (500 mg total) by mouth 2 (two) times daily for 5 days. 04/01/21 04/06/21  Al Decant, PA-C  COVID-19 Ad26 vaccine, JANSSEN/J&J, 0.5 ML injection Inject into the muscle. 11/10/20   Judyann Munson, MD  omeprazole (PRILOSEC) 10 MG capsule Take 1 capsule (10 mg total) by mouth in the morning and at bedtime. 04/01/21 05/01/21  Al Decant, PA-C  ondansetron (ZOFRAN) 8 MG tablet Take 0.5 tablets (4 mg total) by mouth every 8 (eight) hours as needed for nausea or vomiting. 04/01/21    Al Decant, PA-C      Allergies    Sulfanilamide    Review of Systems   Review of Systems  Constitutional:  Negative for chills and fever.  HENT:  Negative for congestion and sore throat.   Gastrointestinal:  Positive for abdominal pain, nausea and vomiting.   Physical Exam Updated Vital Signs BP 135/83    Pulse 60    Temp 98.2 F (36.8 C) (Oral)    Resp 17    Ht 5\' 2"  (1.575 m)    Wt 58.5 kg    LMP 03/27/2021    SpO2 100%    BMI 23.59 kg/m  Physical Exam Vitals and nursing note reviewed.  Constitutional:      Appearance: Normal appearance.  HENT:     Head: Normocephalic and atraumatic.  Eyes:     General: No scleral icterus. Cardiovascular:     Rate and Rhythm: Normal rate and regular rhythm.  Pulmonary:     Effort: Pulmonary effort is normal.     Breath sounds: Normal breath sounds.  Abdominal:     General: Abdomen is flat. Bowel sounds are normal.     Palpations: Abdomen is soft.     Tenderness: There is generalized abdominal tenderness. There is no guarding or rebound. Negative signs include Murphy's sign, Rovsing's sign and McBurney's sign.  Musculoskeletal:        General: No deformity.  Cervical back: Normal range of motion.  Skin:    General: Skin is warm and dry.  Neurological:     General: No focal deficit present.     Mental Status: She is alert. Mental status is at baseline.    ED Results / Procedures / Treatments   Labs (all labs ordered are listed, but only abnormal results are displayed) Labs Reviewed  CBC WITH DIFFERENTIAL/PLATELET - Abnormal; Notable for the following components:      Result Value   WBC 12.0 (*)    Neutro Abs 9.2 (*)    All other components within normal limits  COMPREHENSIVE METABOLIC PANEL - Abnormal; Notable for the following components:   Total Protein 8.6 (*)    All other components within normal limits  MAGNESIUM  LIPASE, BLOOD  PREGNANCY, URINE  URINALYSIS, ROUTINE W REFLEX MICROSCOPIC     EKG None  Radiology  CT ABDOMEN PELVIS W CONTRAST  Result Date: 04/01/2021 CLINICAL DATA:  Abdominal pain, acute, nonlocalized EXAM: CT ABDOMEN AND PELVIS WITH CONTRAST TECHNIQUE: Multidetector CT imaging of the abdomen and pelvis was performed using the standard protocol following bolus administration of intravenous contrast. RADIATION DOSE REDUCTION: This exam was performed according to the departmental dose-optimization program which includes automated exposure control, adjustment of the mA and/or kV according to patient size and/or use of iterative reconstruction technique. CONTRAST:  OMNIPAQUE IOHEXOL 300 MG/ML  SOLN COMPARISON:  None. FINDINGS: Lower chest: No acute abnormality. Hepatobiliary: Hypodensity along the false form ligament likely represents focal fatty infiltration. No focal liver abnormality. No gallstones, gallbladder wall thickening, or pericholecystic fluid. No biliary dilatation. Pancreas: No focal lesion. Normal pancreatic contour. No surrounding inflammatory changes. No main pancreatic ductal dilatation. Normal in size without focal abnormality. Spleen: Normal in size without focal abnormality. Adrenals/Urinary Tract: No adrenal nodule bilaterally. Bilateral kidneys enhance symmetrically. No hydronephrosis. No hydroureter. The urinary bladder is unremarkable. Stomach/Bowel: Stomach is within normal limits. No evidence of bowel wall thickening or dilatation. The appendix is not definitely identified with no inflammatory changes in the right lower quadrant to suggest acute appendicitis. Vascular/Lymphatic: No abdominal aorta or iliac aneurysm. No abdominal, pelvic, or inguinal lymphadenopathy. Reproductive: Uterus and bilateral ovaries/adnexa are unremarkable. Other: Trace free fluid with the pelvis. No intraperitoneal free gas. No organized fluid collection. Musculoskeletal: No abdominal wall hernia or abnormality. No suspicious lytic or blastic osseous lesions. No acute  displaced fracture. IMPRESSION: No acute intra-abdominal or intrapelvic abnormality. Electronically Signed   By: Tish Frederickson M.D.   On: 04/01/2021 16:31   US Abdomen Limited RUQ (LIVER/GB)  Result Date: 04/03/2021 CLINICAL DATA:  Right upper quadrant pain with nausea vomiting. EXAM: ULTRASOUND ABDOMEN LIMITED RIGHT UPPER QUADRANT COMPARISON:  CT abdomen pelvis, 04/01/2021. FINDINGS: Gallbladder: No gallstones or wall thickening visualized. No sonographic Murphy sign noted by sonographer. Common bile duct: Diameter: 3 mm Liver: No focal lesion identified. Within normal limits in parenchymal echogenicity. Portal vein is patent on color Doppler imaging with normal direction of blood flow towards the liver. Other: None. IMPRESSION: Normal right upper quadrant ultrasound. Electronically Signed   By: Amie Portland M.D.   On: 04/03/2021 18:13     Procedures Procedures   Medications Ordered in ED Medications  lactated ringers bolus 1,000 mL (1,000 mLs Intravenous New Bag/Given 04/03/21 1614)  droperidol (INAPSINE) 2.5 MG/ML injection 1.25 mg (1.25 mg Intravenous Given 04/03/21 1714)  diphenhydrAMINE (BENADRYL) injection 25 mg (25 mg Intravenous Given 04/03/21 1712)    ED Course/ Medical Decision Making/ A&P  Medical Decision Making Amount and/or Complexity of Data Reviewed Labs: ordered. Radiology: ordered.  Risk OTC drugs. Prescription drug management.   21 year old female presents emergency department for evaluation of nausea, vomiting, abdominal pain for 3 days.  Differential diagnosis includes was not limited to hyperemesis from cannabinoid use, cholecystitis, colitis, diverticulitis, ulcer disease, GERD.  Vital signs show normotensive, afebrile, normal pulse rate, normal respirations and satting well on room air without any increased work of breathing.  Physical exam shows diffuse abdominal tenderness with no focal tenderness.  No guarding or rebounding.  No CVA  tenderness.  Mostly dry membranes. The patient has not had any vomiting since being here.   Patient denies any melena or any hematochezia.  Doubt peptic ulcer disease.  Doubt any colitis Colitis for the Same Reason and the Patient Also Had a Negative CT Scan Done 2 Days Ago.  Doubt Any Cholecystitis Given the Normal Right Upper Quadrant Ultrasound.  I Likely Think This Is Her Hyperemesis from Cannabinoid Use As She Continues to Use Marijuana and Not Pick up Her Medications.  The patient has been seen here the past 2 days for this same complaint. She was unable to pick up the medications due to cost. I have view both notes and looked at th previous two days of labs and imaging. Her recent Abdomen/Pelvis CT scan was unremarkable. At this time, she has not failed outpatient therapy as she has not taken any outpatient medications.  I do not think any CT imaging is needed at this time.  Mom called the [facility] with concern for her gallbladder is gallbladder disease runs in the family and would like her tested for this.  I do think this is reasonable.  I independently reviewed and interpreted the patient's labs and imaging and agree with radiologist interpretation.  CBC shows mildly increased white blood cell count at 12.0 with a left shift.  No anemia.  Her white blood cell count was slightly elevated yesterday at 11.1.  Magnesium normal.  Lipase normal.  Pregnancy negative.  Urinalysis shows hazy urine with small amount of blood and 80 ketones with trace leukocytes.  Patient had 80 ketones in urinalysis present to this as well.  CMP shows mildly increased protein 8.6 otherwise no electrolyte abnormalities.  Normal glucose.  Ultrasound is negative for any cholecystitis.  Negative right upper quadrant ultrasound.  The EKG did not crossover in MUSE, however EKG was performed and patient had normal Qtc.  I went to evaluate the patient multiple times and she was sleeping.  Abdominal exam unchanged.  While in  the emergency department, the patient received 2 L of LR, droperidol, Phenergan, and a GI cocktail.  The patient was able to tolerate p.o. fluids without any emesis.  I discussed with the patient that she has Medicaid insurance and that they should cover for her cost of her medications.  Recommended that she go to her pharmacy and bring her insurance card with her to make sure that that is being followed correctly.  If there is some problem, I printed out good Rx coupons for the medications I am sending her home on to obtain these medications at a better price.  I discussed the patient the importance of taking these medications outpatient to prevent recurrence in the emergency department.  Strict return precautions were discussed.  Patient agrees with plan.  Patient is stable being discharged home in good condition.  I discussed this case with my attending physician who cosigned this note including patient's  presenting symptoms, physical exam, and planned diagnostics and interventions. Attending physician stated agreement with plan or made changes to plan which were implemented.  Final Clinical Impression(s) / ED Diagnoses Final diagnoses:  RUQ pain  Nausea and vomiting, unspecified vomiting type    Rx / DC Orders ED Discharge Orders          Ordered    ondansetron (ZOFRAN) 4 MG tablet  Every 8 hours PRN        04/03/21 2321    omeprazole (PRILOSEC) 10 MG capsule  2 times daily        04/03/21 2321              Achille Rich, PA-C 04/08/21 2358    Melene Plan, DO 04/09/21 0715

## 2021-04-04 ENCOUNTER — Telehealth (HOSPITAL_BASED_OUTPATIENT_CLINIC_OR_DEPARTMENT_OTHER): Payer: Self-pay | Admitting: *Deleted

## 2021-04-04 NOTE — Telephone Encounter (Signed)
Post ED Visit - Positive Culture Follow-up: Unsuccessful Patient Follow-up ? ?Culture assessed and recommendations reviewed by: ? ?[x]  Bertis Ruddy Pharm.D. ?[]  Heide Guile, Pharm.D., BCPS AQ-ID ?[]  Parks Neptune, Pharm.D., BCPS ?[]  Alycia Rossetti, Pharm.D., BCPS ?[]  Smarr, Pharm.D., BCPS, AAHIVP ?[]  Legrand Como, Pharm.D., BCPS, AAHIVP ?[]  Wynell Balloon, PharmD ?[]  Vincenza Hews, PharmD, BCPS ? ?Positive urine culture ? ?[]  Patient discharged without antimicrobial prescription and treatment is now indicated ?[x]  Organism is resistant to prescribed ED discharge antimicrobial ?[]  Patient with positive blood cultures ? ?D/C Keflex ?Give Macrobid 100mg  BID x 5days  ? ? ?Unable to contact patient , letter will be sent to address on file ? ?Rosie Fate ?04/04/2021, 2:03 PM  ?

## 2021-04-04 NOTE — Progress Notes (Signed)
ED Antimicrobial Stewardship Positive Culture Follow Up  ? ?Judy Daniel is an 21 y.o. female who presented to [facility] on 04/03/2021 with a chief complaint of  ?Chief Complaint  ?Patient presents with  ? Abdominal Pain  ? ? ?Recent Results (from the past 720 hour(s))  ?Urine Culture     Status: Abnormal  ? Collection Time: 04/01/21  4:04 PM  ? Specimen: Urine, Clean Catch  ?Result Value Ref Range Status  ? Specimen Description   Final  ?  URINE, CLEAN CATCH ?Performed at [facility], [address], [place], [place] [address] ?  ? Special Requests   Final  ?  NONE ?Performed at [facility], [address] [place], [place] [address] ?  ? Culture >=100,000 COLONIES/mL STAPHYLOCOCCUS SAPROPHYTICUS (A)  Final  ? Report Status 04/03/2021 FINAL  Final  ? Organism ID, Bacteria STAPHYLOCOCCUS SAPROPHYTICUS (A)  Final  ?    Susceptibility  ? Staphylococcus saprophyticus - MIC*  ?  CIPROFLOXACIN <=0.5 SENSITIVE Sensitive   ?  GENTAMICIN <=0.5 SENSITIVE Sensitive   ?  NITROFURANTOIN <=16 SENSITIVE Sensitive   ?  OXACILLIN 0.5 RESISTANT Resistant   ?  TETRACYCLINE <=1 SENSITIVE Sensitive   ?  VANCOMYCIN 1 SENSITIVE Sensitive   ?  TRIMETH/SULFA <=10 SENSITIVE Sensitive   ?  CLINDAMYCIN <=0.25 SENSITIVE Sensitive   ?  RIFAMPIN <=0.5 SENSITIVE Sensitive   ?  Inducible Clindamycin NEGATIVE Sensitive   ?  * >=100,000 COLONIES/mL STAPHYLOCOCCUS SAPROPHYTICUS  ?Wet prep, genital     Status: Abnormal  ? Collection Time: 04/01/21  4:20 PM  ? Specimen: Cervix  ?Result Value Ref Range Status  ? Yeast Wet Prep HPF POC NONE SEEN NONE SEEN Final  ? Trich, Wet Prep NONE SEEN NONE SEEN Final  ? Clue Cells Wet Prep HPF POC NONE SEEN NONE SEEN Final  ? WBC, Wet Prep HPF POC >=10 (A) <10 Final  ? Sperm NONE SEEN  Final  ?  Comment: Performed at [facility], [address] [place], [place] [address]  ? ? ?[x]  Treated with keflex, organism resistant to prescribed antimicrobial ? ? ?New antibiotic  prescription: Macrobid ? ?ED Provider: , PA-C ? ? ?Army Melia ?04/04/2021, 10:09 AM ?Clinical Pharmacist ?Monday - Friday phone -  [phone] ?Saturday - Sunday phone - [phone] ? ?

## 2021-04-05 ENCOUNTER — Encounter (HOSPITAL_COMMUNITY): Payer: Self-pay

## 2021-04-05 ENCOUNTER — Emergency Department (HOSPITAL_COMMUNITY): Payer: Medicaid Other

## 2021-04-05 ENCOUNTER — Emergency Department (HOSPITAL_COMMUNITY)
Admission: EM | Admit: 2021-04-05 | Discharge: 2021-04-05 | Disposition: A | Discharge status: Home / Self Care | Payer: Medicaid Other | Attending: Emergency Medicine | Admitting: Emergency Medicine

## 2021-04-05 ENCOUNTER — Other Ambulatory Visit: Payer: Self-pay

## 2021-04-05 DIAGNOSIS — R197 Diarrhea, unspecified: Secondary | ICD-10-CM | POA: Diagnosis not present

## 2021-04-05 DIAGNOSIS — R112 Nausea with vomiting, unspecified: Secondary | ICD-10-CM | POA: Diagnosis not present

## 2021-04-05 DIAGNOSIS — R1013 Epigastric pain: Secondary | ICD-10-CM

## 2021-04-05 DIAGNOSIS — R748 Abnormal levels of other serum enzymes: Secondary | ICD-10-CM | POA: Diagnosis not present

## 2021-04-05 DIAGNOSIS — R102 Pelvic and perineal pain: Secondary | ICD-10-CM

## 2021-04-05 DIAGNOSIS — R1084 Generalized abdominal pain: Secondary | ICD-10-CM | POA: Diagnosis present

## 2021-04-05 LAB — CBC
HCT: 46.8 % — ABNORMAL HIGH (ref 36.0–46.0)
Hemoglobin: 15.5 g/dL — ABNORMAL HIGH (ref 12.0–15.0)
MCH: 29 pg (ref 26.0–34.0)
MCHC: 33.1 g/dL (ref 30.0–36.0)
MCV: 87.6 fL (ref 80.0–100.0)
Platelets: 313 10*3/uL (ref 150–400)
RBC: 5.34 MIL/uL — ABNORMAL HIGH (ref 3.87–5.11)
RDW: 12.1 % (ref 11.5–15.5)
WBC: 9.7 10*3/uL (ref 4.0–10.5)
nRBC: 0 % (ref 0.0–0.2)

## 2021-04-05 LAB — COMPREHENSIVE METABOLIC PANEL
ALT: 25 U/L (ref 0–44)
AST: 28 U/L (ref 15–41)
Albumin: 4.9 g/dL (ref 3.5–5.0)
Alkaline Phosphatase: 49 U/L (ref 38–126)
Anion gap: 13 (ref 5–15)
BUN: 8 mg/dL (ref 6–20)
CO2: 24 mmol/L (ref 22–32)
Calcium: 9.2 mg/dL (ref 8.9–10.3)
Chloride: 99 mmol/L (ref 98–111)
Creatinine, Ser: 0.93 mg/dL (ref 0.44–1.00)
GFR, Estimated: >60 mL/min (ref >60)
Glucose, Bld: 92 mg/dL (ref 70–99)
Potassium: 3.4 mmol/L — ABNORMAL LOW (ref 3.5–5.1)
Sodium: 136 mmol/L (ref 135–145)
Total Bilirubin: 1 mg/dL (ref 0.3–1.2)
Total Protein: 8.9 g/dL — ABNORMAL HIGH (ref 6.5–8.1)

## 2021-04-05 LAB — URINALYSIS, ROUTINE W REFLEX MICROSCOPIC
Bilirubin Urine: NEGATIVE
Glucose, UA: NEGATIVE mg/dL
Hgb urine dipstick: NEGATIVE
Ketones, ur: 80 mg/dL — AB
Leukocytes,Ua: NEGATIVE
Nitrite: NEGATIVE
Protein, ur: NEGATIVE mg/dL
Specific Gravity, Urine: 1.012 (ref 1.005–1.030)
pH: 7 (ref 5.0–8.0)

## 2021-04-05 LAB — I-STAT BETA HCG BLOOD, ED (MC, WL, AP ONLY): I-stat hCG, quantitative: <5 m[IU]/mL (ref <5)

## 2021-04-05 LAB — LIPASE, BLOOD: Lipase: 78 U/L — ABNORMAL HIGH (ref 11–51)

## 2021-04-05 MED ORDER — SODIUM CHLORIDE (PF) 0.9 % IJ SOLN
INTRAMUSCULAR | Status: AC
  Filled 2021-04-05: qty 50

## 2021-04-05 MED ORDER — KETOROLAC TROMETHAMINE 15 MG/ML IJ SOLN
15.0000 mg | Freq: Once | INTRAMUSCULAR | Status: AC
  Administered 2021-04-05: 15 mg via INTRAVENOUS
  Filled 2021-04-05: qty 1

## 2021-04-05 MED ORDER — SODIUM CHLORIDE 0.9 % IV BOLUS
1000.0000 mL | Freq: Once | INTRAVENOUS | Status: AC
  Administered 2021-04-05: 1000 mL via INTRAVENOUS

## 2021-04-05 MED ORDER — IOHEXOL 300 MG/ML  SOLN
100.0000 mL | Freq: Once | INTRAMUSCULAR | Status: AC | PRN
  Administered 2021-04-05: 100 mL via INTRAVENOUS

## 2021-04-05 MED ORDER — ONDANSETRON HCL 4 MG/2ML IJ SOLN
4.0000 mg | Freq: Once | INTRAMUSCULAR | Status: AC
  Administered 2021-04-05: 4 mg via INTRAVENOUS
  Filled 2021-04-05: qty 2

## 2021-04-05 NOTE — ED Notes (Signed)
Pt back from CT at this time 

## 2021-04-05 NOTE — ED Provider Notes (Signed)
?[facility]-EMERGENCY DEPT ?Provider Note ? ? ?CSN: [id number] ?Arrival date & time: 04/05/21  1021 ? ?  ? ?History ? ?Chief Complaint  ?Patient presents with  ? Abdominal Pain  ? Emesis  ? Diarrhea  ? ? ?Judy Daniel is a 21 y.o. female. ? ?Pt is a 21 yo female presenting for generalized abdominal pain, worse in the epigastric region, associated with nausea and vomiting x 4 days. Denies current pregnancy. Denies sick contacts. No past abdominal surgical hx. Denies recent alcohol consumption or hx of gallstones ? ? ?The history is provided by the patient. No language interpreter was used.  ?Abdominal Pain ?Associated symptoms: diarrhea and vomiting   ?Associated symptoms: no chest pain, no chills, no cough, no dysuria, no fever, no hematuria, no shortness of breath and no sore throat   ?Emesis ?Associated symptoms: abdominal pain and diarrhea   ?Associated symptoms: no arthralgias, no chills, no cough, no fever and no sore throat   ?Diarrhea ?Associated symptoms: abdominal pain and vomiting   ?Associated symptoms: no arthralgias, no chills and no fever   ? ?  ? ?Home Medications ?Prior to Admission medications   ?Not on File  ?   ? ?Allergies    ?Sulfa antibiotics   ? ?Review of Systems   ?Review of Systems  ?Constitutional:  Negative for chills and fever.  ?HENT:  Negative for ear pain and sore throat.   ?Eyes:  Negative for pain and visual disturbance.  ?Respiratory:  Negative for cough and shortness of breath.   ?Cardiovascular:  Negative for chest pain and palpitations.  ?Gastrointestinal:  Positive for abdominal pain, diarrhea and vomiting.  ?Genitourinary:  Negative for dysuria and hematuria.  ?Musculoskeletal:  Negative for arthralgias and back pain.  ?Skin:  Negative for color change and rash.  ?Neurological:  Negative for seizures and syncope.  ?All other systems reviewed and are negative. ? ?Physical Exam ?Updated Vital Signs ?BP (!) 146/91 (BP Location: Left Arm)   Pulse (!) 55    Temp 98.7 ?F (37.1 ?C) (Oral)   Resp 20   Ht 5\' 2"  (1.575 m)   Wt 58.5 kg   LMP 03/27/2021   SpO2 100%   BMI 23.59 kg/m?  ?Physical Exam ?Vitals and nursing note reviewed.  ?Constitutional:   ?   General: She is not in acute distress. ?   Appearance: She is well-developed.  ?HENT:  ?   Head: Normocephalic and atraumatic.  ?Eyes:  ?   Conjunctiva/sclera: Conjunctivae normal.  ?Cardiovascular:  ?   Rate and Rhythm: Normal rate and regular rhythm.  ?   Heart sounds: No murmur heard. ?Pulmonary:  ?   Effort: Pulmonary effort is normal. No respiratory distress.  ?   Breath sounds: Normal breath sounds.  ?Abdominal:  ?   Palpations: Abdomen is soft.  ?   Tenderness: There is abdominal tenderness in the epigastric area.  ?Musculoskeletal:     ?   General: No swelling.  ?   Cervical back: Neck supple.  ?Skin: ?   General: Skin is warm and dry.  ?   Capillary Refill: Capillary refill takes less than 2 seconds.  ?Neurological:  ?   Mental Status: She is alert.  ?Psychiatric:     ?   Mood and Affect: Mood normal.  ? ? ?ED Results / Procedures / Treatments   ?Labs ?(all labs ordered are listed, but only abnormal results are displayed) ?Labs Reviewed  ?LIPASE, BLOOD - Abnormal; Notable for the following  components:  ?    Result Value  ? Lipase 78 (*)   ? All other components within normal limits  ?COMPREHENSIVE METABOLIC PANEL - Abnormal; Notable for the following components:  ? Potassium 3.4 (*)   ? Total Protein 8.9 (*)   ? All other components within normal limits  ?CBC - Abnormal; Notable for the following components:  ? RBC 5.34 (*)   ? Hemoglobin 15.5 (*)   ? HCT 46.8 (*)   ? All other components within normal limits  ?URINALYSIS, ROUTINE W REFLEX MICROSCOPIC  ?I-STAT BETA HCG BLOOD, ED ([facility], [facility], [facility] ONLY)  ? ? ?EKG ?None ? ?Radiology ?No results found. ? ?Procedures ?Procedures  ? ? ?Medications Ordered in ED ?Medications  ?ketorolac (TORADOL) 15 MG/ML injection 15 mg (15 mg Intravenous Given 04/05/21 1142)   ?ondansetron (ZOFRAN) injection 4 mg (4 mg Intravenous Given 04/05/21 1142)  ?sodium chloride 0.9 % bolus 1,000 mL (1,000 mLs Intravenous New Bag/Given 04/05/21 1142)  ? ? ?ED Course/ Medical Decision Making/ A&P ?  ?                        ?Medical Decision Making ?Amount and/or Complexity of Data Reviewed ?Labs: ordered. ?Radiology: ordered. ? ?Risk ?Prescription drug management. ? ? ?12:21 PM ?21 yo female presenting for generalized abdominal pain, worse in the epigastric region, associated with nausea and vomiting x 4 days. On exam pt is Aox3 , no acute distress, afebrile, with stable vitals. Physical exam demonstrates soft abdomen with tenderness in epigastric region. Lipase mildly elevated. Stable liver profile and bilirubins wnl. CT abdomen demonstrates no gallstones. No pancreatitis. Doubt choledocholithiasis with normal bilirubins. Evidence of possible ruptured ovarian cyst due to small amount of free fluid in pelvis. No ectopic-negative pregnancy. No vaginal symptoms. No urinary symptoms.  ? ?Transvaginal US pending.  ?Otherwise patient is in no acute distress. Pain improved here in ED. No s/s sepsis. Stable for DC home pending [place] results.  ? ?\US results stable. Patient in no distress and overall condition improved here in the ED. Detailed discussions were had with the patient regarding current findings, and need for close f/u with PCP or on call doctor. The patient has been instructed to return immediately if the symptoms worsen in any way for re-evaluation. Patient verbalized understanding and is in agreement with current care plan. All questions answered prior to discharge. ? ? ? ? ? ? ? ?Final Clinical Impression(s) / ED Diagnoses ?Final diagnoses:  ?Epigastric pain  ? ? ?Rx / DC Orders ?ED Discharge Orders   ? ? None  ? ?  ? ? ?  ?Franne Forts, DO ?04/05/21 1543 ? ?

## 2021-04-05 NOTE — ED Triage Notes (Signed)
Paatient c/o mid abdominal pain, n/v/d x 4 days. Patient states she was seen earlier for the same and the meds prescribed are not helping. ?

## 2021-04-05 NOTE — ED Notes (Signed)
EDP at the bedside.  ?

## 2021-04-06 ENCOUNTER — Encounter (HOSPITAL_COMMUNITY): Payer: Self-pay

## 2021-06-11 ENCOUNTER — Emergency Department (HOSPITAL_COMMUNITY)
Admission: EM | Admit: 2021-06-11 | Discharge: 2021-06-12 | Discharge status: Against Medical Advice | Payer: Medicaid Other | Attending: Emergency Medicine | Admitting: Emergency Medicine

## 2021-06-11 ENCOUNTER — Emergency Department (HOSPITAL_COMMUNITY): Payer: Medicaid Other

## 2021-06-11 ENCOUNTER — Other Ambulatory Visit: Payer: Self-pay

## 2021-06-11 ENCOUNTER — Encounter (HOSPITAL_COMMUNITY): Payer: Self-pay

## 2021-06-11 DIAGNOSIS — Z5321 Procedure and treatment not carried out due to patient leaving prior to being seen by health care provider: Secondary | ICD-10-CM | POA: Diagnosis not present

## 2021-06-11 DIAGNOSIS — E876 Hypokalemia: Secondary | ICD-10-CM | POA: Insufficient documentation

## 2021-06-11 DIAGNOSIS — R112 Nausea with vomiting, unspecified: Secondary | ICD-10-CM | POA: Insufficient documentation

## 2021-06-11 DIAGNOSIS — R1013 Epigastric pain: Secondary | ICD-10-CM | POA: Insufficient documentation

## 2021-06-11 DIAGNOSIS — R197 Diarrhea, unspecified: Secondary | ICD-10-CM | POA: Insufficient documentation

## 2021-06-11 DIAGNOSIS — R509 Fever, unspecified: Secondary | ICD-10-CM | POA: Diagnosis not present

## 2021-06-11 LAB — COMPREHENSIVE METABOLIC PANEL
ALT: 17 U/L (ref 0–44)
AST: 19 U/L (ref 15–41)
Albumin: 4.5 g/dL (ref 3.5–5.0)
Alkaline Phosphatase: 50 U/L (ref 38–126)
Anion gap: 11 (ref 5–15)
BUN: 6 mg/dL (ref 6–20)
CO2: 26 mmol/L (ref 22–32)
Calcium: 9.7 mg/dL (ref 8.9–10.3)
Chloride: 101 mmol/L (ref 98–111)
Creatinine, Ser: 1.07 mg/dL — ABNORMAL HIGH (ref 0.44–1.00)
GFR, Estimated: >60 mL/min (ref >60)
Glucose, Bld: 93 mg/dL (ref 70–99)
Potassium: 3.1 mmol/L — ABNORMAL LOW (ref 3.5–5.1)
Sodium: 138 mmol/L (ref 135–145)
Total Bilirubin: 1.1 mg/dL (ref 0.3–1.2)
Total Protein: 8 g/dL (ref 6.5–8.1)

## 2021-06-11 LAB — URINALYSIS, ROUTINE W REFLEX MICROSCOPIC
Bilirubin Urine: NEGATIVE
Glucose, UA: NEGATIVE mg/dL
Ketones, ur: 20 mg/dL — AB
Leukocytes,Ua: NEGATIVE
Nitrite: NEGATIVE
Protein, ur: NEGATIVE mg/dL
Specific Gravity, Urine: 1.023 (ref 1.005–1.030)
pH: 6 (ref 5.0–8.0)

## 2021-06-11 LAB — LIPASE, BLOOD: Lipase: 41 U/L (ref 11–51)

## 2021-06-11 LAB — I-STAT BETA HCG BLOOD, ED (MC, WL, AP ONLY): I-stat hCG, quantitative: <5 m[IU]/mL (ref <5)

## 2021-06-11 LAB — CBC
HCT: 45.3 % (ref 36.0–46.0)
Hemoglobin: 15.1 g/dL — ABNORMAL HIGH (ref 12.0–15.0)
MCH: 29.2 pg (ref 26.0–34.0)
MCHC: 33.3 g/dL (ref 30.0–36.0)
MCV: 87.5 fL (ref 80.0–100.0)
Platelets: 415 10*3/uL — ABNORMAL HIGH (ref 150–400)
RBC: 5.18 MIL/uL — ABNORMAL HIGH (ref 3.87–5.11)
RDW: 11.7 % (ref 11.5–15.5)
WBC: 6.5 10*3/uL (ref 4.0–10.5)
nRBC: 0 % (ref 0.0–0.2)

## 2021-06-11 MED ORDER — ONDANSETRON 4 MG PO TBDP
4.0000 mg | ORAL_TABLET | Freq: Once | ORAL | Status: AC
  Administered 2021-06-11: 4 mg via ORAL
  Filled 2021-06-11: qty 1

## 2021-06-11 MED ORDER — IOHEXOL 350 MG/ML SOLN
70.0000 mL | Freq: Once | INTRAVENOUS | Status: AC | PRN
  Administered 2021-06-11: 70 mL via INTRAVENOUS

## 2021-06-11 NOTE — ED Provider Triage Note (Signed)
Emergency Medicine Provider Triage Evaluation Note ? ?Judy Daniel , a 21 y.o. female  was evaluated in triage.  Pt complains of abdominal pain with N/V/D over the last week, with intermittent fevers.  Abdominal pain localized to the RLQ, umbilicus, and epigastric regions.  Diarrhea described as brown, red, green.  Vomiting described as bilious.  Tried ibuprofen, was unable to keep it down.  Unable to keep down food/fluids since symptom onset.  No Hx of abdominal surgeries.  Traveled through [place], but not outside the country about 3 weeks ago.  On current menstrual cycle. ? ?Review of Systems  ?Positive: As above ?Negative: As above ? ?Physical Exam  ?BP 104/77 (BP Location: Right Arm)   Pulse (!) 102   Temp 99.2 ?F (37.3 ?C) (Oral)   Resp 18   Ht 5\' 2"  (1.575 m)   Wt 58.5 kg   SpO2 100%   BMI 23.59 kg/m?  ?Gen:   Awake, no distress, actively vomiting ?Resp:  Normal effort, CTAB ?MSK:   Moves extremities without difficulty  ?Other:  Abdomen soft, tender in RLQ, umbilical region, epigastric region.  Tachycardic. ? ?Medical Decision Making  ?Medically screening exam initiated at 2:20 PM.  Appropriate orders placed.  Jozlyn Dineke Seder was informed that the remainder of the evaluation will be completed by another provider, this initial triage assessment does not replace that evaluation, and the importance of remaining in the ED until their evaluation is complete. ? ?Labs, imaging, Zofran ordered ?  ? , PA-C ?06/11/21 1425 ? ?

## 2021-06-11 NOTE — ED Triage Notes (Signed)
Pt c/o epigastric pain, N/V/Dx1wk. Pt is actively vomiting in emesis bag. Pt's states on menstrual now, it started four days ago and it's normal menstrual.  ?

## 2021-06-12 ENCOUNTER — Other Ambulatory Visit: Payer: Self-pay

## 2021-06-12 ENCOUNTER — Encounter (HOSPITAL_BASED_OUTPATIENT_CLINIC_OR_DEPARTMENT_OTHER): Payer: Self-pay | Admitting: Emergency Medicine

## 2021-06-12 ENCOUNTER — Emergency Department (HOSPITAL_BASED_OUTPATIENT_CLINIC_OR_DEPARTMENT_OTHER)
Admission: EM | Admit: 2021-06-12 | Discharge: 2021-06-13 | Disposition: A | Discharge status: Home / Self Care | Payer: Medicaid Other | Source: Home / Self Care | Attending: Emergency Medicine | Admitting: Emergency Medicine

## 2021-06-12 DIAGNOSIS — E876 Hypokalemia: Secondary | ICD-10-CM | POA: Insufficient documentation

## 2021-06-12 DIAGNOSIS — R197 Diarrhea, unspecified: Secondary | ICD-10-CM | POA: Insufficient documentation

## 2021-06-12 DIAGNOSIS — R112 Nausea with vomiting, unspecified: Secondary | ICD-10-CM | POA: Insufficient documentation

## 2021-06-12 DIAGNOSIS — R109 Unspecified abdominal pain: Secondary | ICD-10-CM | POA: Insufficient documentation

## 2021-06-12 LAB — CBC
HCT: 42.3 % (ref 36.0–46.0)
Hemoglobin: 14.5 g/dL (ref 12.0–15.0)
MCH: 29.4 pg (ref 26.0–34.0)
MCHC: 34.3 g/dL (ref 30.0–36.0)
MCV: 85.8 fL (ref 80.0–100.0)
Platelets: 421 10*3/uL — ABNORMAL HIGH (ref 150–400)
RBC: 4.93 MIL/uL (ref 3.87–5.11)
RDW: 11.7 % (ref 11.5–15.5)
WBC: 6.7 10*3/uL (ref 4.0–10.5)
nRBC: 0 % (ref 0.0–0.2)

## 2021-06-12 LAB — COMPREHENSIVE METABOLIC PANEL
ALT: 20 U/L (ref 0–44)
AST: 28 U/L (ref 15–41)
Albumin: 4.5 g/dL (ref 3.5–5.0)
Alkaline Phosphatase: 49 U/L (ref 38–126)
Anion gap: 14 (ref 5–15)
BUN: 9 mg/dL (ref 6–20)
CO2: 23 mmol/L (ref 22–32)
Calcium: 9.4 mg/dL (ref 8.9–10.3)
Chloride: 102 mmol/L (ref 98–111)
Creatinine, Ser: 1.08 mg/dL — ABNORMAL HIGH (ref 0.44–1.00)
GFR, Estimated: >60 mL/min (ref >60)
Glucose, Bld: 106 mg/dL — ABNORMAL HIGH (ref 70–99)
Potassium: 2.9 mmol/L — ABNORMAL LOW (ref 3.5–5.1)
Sodium: 139 mmol/L (ref 135–145)
Total Bilirubin: 1 mg/dL (ref 0.3–1.2)
Total Protein: 8 g/dL (ref 6.5–8.1)

## 2021-06-12 LAB — LIPASE, BLOOD: Lipase: 55 U/L — ABNORMAL HIGH (ref 11–51)

## 2021-06-12 MED ORDER — ONDANSETRON HCL 4 MG/2ML IJ SOLN
4.0000 mg | Freq: Once | INTRAMUSCULAR | Status: AC | PRN
Start: 2021-06-12 — End: 2021-06-12
  Administered 2021-06-12: 4 mg via INTRAVENOUS
  Filled 2021-06-12: qty 2

## 2021-06-12 NOTE — ED Triage Notes (Signed)
Reports diffuse abdominal pain for the last week with n/v/d.  Was seen at [facility] yesterday.  Reports she left before they got her results. ?

## 2021-06-13 LAB — URINALYSIS, ROUTINE W REFLEX MICROSCOPIC
Glucose, UA: NEGATIVE mg/dL
Ketones, ur: 80 mg/dL — AB
Nitrite: NEGATIVE
Protein, ur: 30 mg/dL — AB
Specific Gravity, Urine: 1.02 (ref 1.005–1.030)
pH: 7 (ref 5.0–8.0)

## 2021-06-13 LAB — URINALYSIS, MICROSCOPIC (REFLEX)

## 2021-06-13 LAB — PREGNANCY, URINE: Preg Test, Ur: NEGATIVE

## 2021-06-13 MED ORDER — ALUM & MAG HYDROXIDE-SIMETH 200-200-20 MG/5ML PO SUSP
30.0000 mL | Freq: Once | ORAL | Status: AC
  Administered 2021-06-13: 30 mL via ORAL
  Filled 2021-06-13: qty 30

## 2021-06-13 MED ORDER — PANTOPRAZOLE SODIUM 40 MG IV SOLR
40.0000 mg | Freq: Once | INTRAVENOUS | Status: AC
  Administered 2021-06-13: 40 mg via INTRAVENOUS
  Filled 2021-06-13: qty 10

## 2021-06-13 MED ORDER — POTASSIUM CHLORIDE CRYS ER 20 MEQ PO TBCR
40.0000 meq | EXTENDED_RELEASE_TABLET | Freq: Once | ORAL | Status: AC
  Administered 2021-06-13: 40 meq via ORAL
  Filled 2021-06-13: qty 2

## 2021-06-13 MED ORDER — ONDANSETRON 4 MG PO TBDP: ORAL_TABLET | ORAL | 0 refills | Status: DC

## 2021-06-13 MED ORDER — POTASSIUM CHLORIDE 10 MEQ/100ML IV SOLN
10.0000 meq | Freq: Once | INTRAVENOUS | Status: AC
Start: 2021-06-13 — End: 2021-06-13
  Administered 2021-06-13: 10 meq via INTRAVENOUS
  Filled 2021-06-13: qty 100

## 2021-06-13 MED ORDER — POTASSIUM CHLORIDE CRYS ER 20 MEQ PO TBCR: 40.0000 meq | EXTENDED_RELEASE_TABLET | Freq: Two times a day (BID) | ORAL | 0 refills | Status: AC

## 2021-06-13 MED ORDER — LACTATED RINGERS IV BOLUS
1000.0000 mL | Freq: Once | INTRAVENOUS | Status: AC
Start: 2021-06-13 — End: 2021-06-13
  Administered 2021-06-13: 1000 mL via INTRAVENOUS

## 2021-06-13 MED ORDER — LIDOCAINE VISCOUS HCL 2 % MT SOLN
15.0000 mL | Freq: Once | OROMUCOSAL | Status: AC
  Administered 2021-06-13: 15 mL via ORAL
  Filled 2021-06-13: qty 15

## 2021-06-13 NOTE — ED Provider Notes (Signed)
?[facility] EMERGENCY DEPARTMENT ?Provider Note ? ? ?CSN: [id number] ?Arrival date & time: 06/12/21  2047 ? ?  ? ?History ?Chief Complaint  ?Patient presents with  ? Abdominal Pain  ? ? ?Judy Daniel is a 21 y.o. female. ? ?21 yo F here with emesis, diarrhea and abdominal pain that has mostly resolved prior to my evaluation. No fevers. No urinary symptoms. No sick contacts. Has been intermittent for nearly a week.  ? ? ?Abdominal Pain ? ?  ? ?Home Medications ?Prior to Admission medications   ?Medication Sig Start Date End Date Taking? Authorizing Provider  ?ondansetron (ZOFRAN-ODT) 4 MG disintegrating tablet 4mg  ODT q4 hours prn nausea/vomit 06/13/21  Yes Lilymae Swiech, 06/15/21, MD  ?potassium chloride SA (KLOR-CON M) 20 MEQ tablet Take 2 tablets (40 mEq total) by mouth 2 (two) times daily for 7 days. 06/13/21 06/20/21 Yes Dameshia Seybold, 06/22/21, MD  ?COVID-19 Ad26 vaccine, JANSSEN/J&J, 0.5 ML injection Inject into the muscle. 11/10/20   01/10/21, MD  ?omeprazole (PRILOSEC) 10 MG capsule Take 1 capsule (10 mg total) by mouth in the morning and at bedtime. 04/03/21 05/03/21  07/03/21, PA-C  ?ondansetron (ZOFRAN) 4 MG tablet Take 1 tablet (4 mg total) by mouth every 8 (eight) hours as needed for nausea or vomiting. 04/03/21   06/03/21, PA-C  ?   ? ?Allergies    ?Sulfa antibiotics and Sulfanilamide   ? ?Review of Systems   ?Review of Systems  ?Gastrointestinal:  Positive for abdominal pain.  ? ?Physical Exam ?Updated Vital Signs ?BP 111/62   Pulse 66   Temp 98.2 ?F (36.8 ?C) (Oral)   Resp 18   Ht 5\' 2"  (1.575 m)   Wt 58.2 kg   LMP 06/11/2021   SpO2 98%   BMI 23.47 kg/m?  ?Physical Exam ?Vitals and nursing note reviewed.  ?Constitutional:   ?   Appearance: She is well-developed.  ?HENT:  ?   Head: Normocephalic and atraumatic.  ?Cardiovascular:  ?   Rate and Rhythm: Normal rate and regular rhythm.  ?Pulmonary:  ?   Effort: No respiratory distress.  ?   Breath sounds: No stridor.  ?Abdominal:  ?    General: There is no distension.  ?   Tenderness: There is no abdominal tenderness.  ?Musculoskeletal:  ?   Cervical back: Normal range of motion.  ?Neurological:  ?   Mental Status: She is alert.  ? ? ?ED Results / Procedures / Treatments   ?Labs ?(all labs ordered are listed, but only abnormal results are displayed) ?Labs Reviewed  ?LIPASE, BLOOD - Abnormal; Notable for the following components:  ?    Result Value  ? Lipase 55 (*)   ? All other components within normal limits  ?COMPREHENSIVE METABOLIC PANEL - Abnormal; Notable for the following components:  ? Potassium 2.9 (*)   ? Glucose, Bld 106 (*)   ? Creatinine, Ser 1.08 (*)   ? All other components within normal limits  ?CBC - Abnormal; Notable for the following components:  ? Platelets 421 (*)   ? All other components within normal limits  ?URINALYSIS, ROUTINE W REFLEX MICROSCOPIC - Abnormal; Notable for the following components:  ? APPearance CLOUDY (*)   ? Hgb urine dipstick TRACE (*)   ? Bilirubin Urine SMALL (*)   ? Ketones, ur 80 (*)   ? Protein, ur 30 (*)   ? Leukocytes,Ua TRACE (*)   ? All other components within normal limits  ?URINALYSIS, MICROSCOPIC (REFLEX) - Abnormal;  Notable for the following components:  ? Bacteria, UA MANY (*)   ? All other components within normal limits  ?PREGNANCY, URINE  ? ? ?EKG ?None ? ?Radiology ?CT ABDOMEN PELVIS W CONTRAST ? ?Result Date: 06/11/2021 ?CLINICAL DATA:  Nausea vomiting right lower quadrant pain EXAM: CT ABDOMEN AND PELVIS WITH CONTRAST TECHNIQUE: Multidetector CT imaging of the abdomen and pelvis was performed using the standard protocol following bolus administration of intravenous contrast. RADIATION DOSE REDUCTION: This exam was performed according to the departmental dose-optimization program which includes automated exposure control, adjustment of the mA and/or kV according to patient size and/or use of iterative reconstruction technique. CONTRAST:  57mL OMNIPAQUE IOHEXOL 350 MG/ML SOLN COMPARISON:   Ultrasound and CT 04/05/2021 FINDINGS: Lower chest: No acute abnormality. Hepatobiliary: No focal liver abnormality is seen. No gallstones, gallbladder wall thickening, or biliary dilatation. Pancreas: Unremarkable. No pancreatic ductal dilatation or surrounding inflammatory changes. Spleen: Normal in size without focal abnormality. Adrenals/Urinary Tract: Adrenal glands are unremarkable. Kidneys are normal, without renal calculi, focal lesion, or hydronephrosis. Bladder is unremarkable. Stomach/Bowel: Stomach is within normal limits. Appendix appears normal. No evidence of bowel wall thickening, distention, or inflammatory changes. Vascular/Lymphatic: No significant vascular findings are present. No enlarged abdominal or pelvic lymph nodes. Reproductive: Uterus and bilateral adnexa are unremarkable. Other: Small free fluid in the pelvis.  No free air. Musculoskeletal: No acute or significant osseous findings. IMPRESSION: 1. No CT evidence for acute intra-abdominal or pelvic abnormality. Negative for acute appendicitis 2. Small free fluid in the pelvis Electronically Signed   By: Jasmine Pang M.D.   On: 06/11/2021 19:59   ? ?Procedures ?Procedures  ? ? ?Medications Ordered in ED ?Medications  ?ondansetron (ZOFRAN) injection 4 mg (4 mg Intravenous Given 06/12/21 2114)  ?lactated ringers bolus 1,000 mL (1,000 mLs Intravenous Bolus 06/13/21 0137)  ?pantoprazole (PROTONIX) injection 40 mg (40 mg Intravenous Given 06/13/21 0138)  ?alum & mag hydroxide-simeth (MAALOX/MYLANTA) 200-200-20 MG/5ML suspension 30 mL (30 mLs Oral Given 06/13/21 0140)  ?  And  ?lidocaine (XYLOCAINE) 2 % viscous mouth solution 15 mL (15 mLs Oral Given 06/13/21 0139)  ?potassium chloride 10 mEq in 100 mL IVPB (0 mEq Intravenous Stopped 06/13/21 0341)  ?potassium chloride SA (KLOR-CON M) CR tablet 40 mEq (40 mEq Oral Given 06/13/21 0140)  ? ? ?ED Course/ Medical Decision Making/ A&P ?  ?                        ?Medical Decision Making ?Amount and/or  Complexity of Data Reviewed ?Labs: ordered. ? ?Risk ?OTC drugs. ?Prescription drug management. ? ? ?Low K, otherwise workup appropriate. Tolerating PO. K replaced, will fu w/ pcp for recheck. otherwise stable for discharge.  ? ? ?  ? ? ? ? ?Final Clinical Impression(s) / ED Diagnoses ?Final diagnoses:  ?Nausea and vomiting, unspecified vomiting type  ?Hypokalemia  ? ? ?Rx / DC Orders ?ED Discharge Orders   ? ?      Ordered  ?  potassium chloride SA (KLOR-CON M) 20 MEQ tablet  2 times daily       ? 06/13/21 0441  ?  ondansetron (ZOFRAN-ODT) 4 MG disintegrating tablet       ? 06/13/21 0441  ? ?  ?  ? ?  ? ? ?  ?Marily Memos, MD ?06/13/21 [phone] ? ?

## 2021-06-24 ENCOUNTER — Ambulatory Visit (INDEPENDENT_AMBULATORY_CARE_PROVIDER_SITE_OTHER): Payer: Medicaid Other | Admitting: Obstetrics

## 2021-06-24 ENCOUNTER — Other Ambulatory Visit (HOSPITAL_COMMUNITY)
Admission: RE | Admit: 2021-06-24 | Discharge: 2021-06-24 | Disposition: A | Discharge status: Home / Self Care | Payer: Medicaid Other | Source: Ambulatory Visit | Attending: Obstetrics | Admitting: Obstetrics

## 2021-06-24 ENCOUNTER — Encounter: Payer: Self-pay | Admitting: Obstetrics

## 2021-06-24 VITALS — BP 124/84 | HR 64 | Ht 62.0 in | Wt 124.0 lb

## 2021-06-24 DIAGNOSIS — N898 Other specified noninflammatory disorders of vagina: Secondary | ICD-10-CM | POA: Diagnosis present

## 2021-06-24 DIAGNOSIS — Z30011 Encounter for initial prescription of contraceptive pills: Secondary | ICD-10-CM | POA: Diagnosis not present

## 2021-06-24 DIAGNOSIS — R300 Dysuria: Secondary | ICD-10-CM | POA: Diagnosis not present

## 2021-06-24 DIAGNOSIS — Z3009 Encounter for other general counseling and advice on contraception: Secondary | ICD-10-CM

## 2021-06-24 LAB — POCT URINALYSIS DIPSTICK
Bilirubin, UA: NEGATIVE
Glucose, UA: NEGATIVE
Ketones, UA: NEGATIVE
Nitrite, UA: NEGATIVE
Protein, UA: POSITIVE — AB
Spec Grav, UA: 1.02 (ref 1.010–1.025)
Urobilinogen, UA: 0.2 E.U./dL
pH, UA: 6 (ref 5.0–8.0)

## 2021-06-24 MED ORDER — LO LOESTRIN FE 1 MG-10 MCG / 10 MCG PO TABS: 1.0000 | ORAL_TABLET | Freq: Every day | ORAL | 11 refills | Status: AC

## 2021-06-24 MED ORDER — CEFUROXIME AXETIL 500 MG PO TABS: 500.0000 mg | ORAL_TABLET | Freq: Two times a day (BID) | ORAL | 0 refills | Status: AC

## 2021-06-24 MED ORDER — FLUCONAZOLE 200 MG PO TABS: 200.0000 mg | ORAL_TABLET | ORAL | 0 refills | Status: AC

## 2021-06-24 NOTE — Progress Notes (Signed)
Patient ID: Judy Daniel, female   DOB: [birth year], 21 y.o.   MRN: [id number]  Chief Complaint  Patient presents with   New Patient (Initial Visit)    HPI Judy Daniel is a 21 y.o. female.  Vaginal discharge with itching, and burning and pain with urination. HPI  History reviewed. No pertinent past medical history.  History reviewed. No pertinent surgical history.  Family History  Problem Relation Age of Onset   Heart disease Paternal Grandfather    Hypertension Maternal Grandmother    Hypercholesterolemia Maternal Grandmother    Cancer Other     Social History Social History   Tobacco Use   Smoking status: Never   Smokeless tobacco: Never  Vaping Use   Vaping Use: Never used  Substance Use Topics   Alcohol use: Not Currently    Comment: reports occasionally   Drug use: Never    Allergies  Allergen Reactions   Sulfa Antibiotics    Sulfanilamide     rash    Current Outpatient Medications  Medication Sig Dispense Refill   cefUROXime (CEFTIN) 500 MG tablet Take 1 tablet (500 mg total) by mouth 2 (two) times daily with a meal. 14 tablet 0   fluconazole (DIFLUCAN) 200 MG tablet Take 1 tablet (200 mg total) by mouth every 3 (three) days. 3 tablet 0   LO LOESTRIN FE 1 MG-10 MCG / 10 MCG tablet Take 1 tablet by mouth daily. Start taking pill on the first day of period. 28 tablet 11   COVID-19 Ad26 vaccine, JANSSEN/J&J, 0.5 ML injection Inject into the muscle. 0.5 mL 0   potassium chloride SA (KLOR-CON M) 20 MEQ tablet Take 2 tablets (40 mEq total) by mouth 2 (two) times daily for 7 days. 28 tablet 0   No current facility-administered medications for this visit.    Review of Systems Review of Systems Constitutional: negative for fatigue and weight loss Respiratory: negative for cough and wheezing Cardiovascular: negative for chest pain, fatigue and palpitations Gastrointestinal: negative for abdominal pain and change in bowel  habits Genitourinary: positive for vaginal discharge and itching, and burning and pain with urination Integument/breast: negative for nipple discharge Musculoskeletal:negative for myalgias Neurological: negative for gait problems and tremors Behavioral/Psych: negative for abusive relationship, depression Endocrine: negative for temperature intolerance      Blood pressure 124/84, pulse 64, height 5\' 2"  (1.575 m), weight 124 lb (56.2 kg), last menstrual period 06/11/2021.  Physical Exam Physical Exam General:   Alert and no distress  Skin:   no rash or abnormalities  Lungs:   clear to auscultation bilaterally  Heart:   regular rate and rhythm, S1, S2 normal, no murmur, click, rub or gallop  Breasts:   normal without suspicious masses, skin or nipple changes or axillary nodes  Abdomen:  normal findings: no organomegaly, soft, non-tender and no hernia  Pelvis:  External genitalia: normal general appearance Urinary system: urethral meatus normal and bladder without fullness, nontender Vaginal: normal without tenderness, induration or masses Cervix: normal appearance Adnexa: normal bimanual exam Uterus: anteverted and non-tender, normal size    I have spent a total of 20 minutes of face-to-face time excluding clinical staff time, reviewing notes and preparing to see patient, ordering tests and/or medications, and counseling the patient.   Data Reviewed Wet prep and cultures Urinalysis   Assessment     1. Dysuria Rx: - Urine Culture - POCT urinalysis dipstick - cefUROXime (CEFTIN) 500 MG tablet; Take 1 tablet (500 mg total) by mouth  2 (two) times daily with a meal.  Dispense: 14 tablet; Refill: 0  2. Vaginal discharge Rx: - Cervicovaginal ancillary only( [place]) - fluconazole (DIFLUCAN) 200 MG tablet; Take 1 tablet (200 mg total) by mouth every 3 (three) days.  Dispense: 3 tablet; Refill: 0  3. Encounter for counseling regarding contraception - options discussed - wants  OCP's  4. Encounter for initial prescription of contraceptive pills Rx: - LO LOESTRIN FE 1 MG-10 MCG / 10 MCG tablet; Take 1 tablet by mouth daily. Start taking pill on the first day of period.  Dispense: 28 tablet; Refill: 11     Plan   Follow up in 3 months virtually for OCP Surveillance  Orders Placed This Encounter  Procedures   Urine Culture   POCT urinalysis dipstick   Meds ordered this encounter  Medications   cefUROXime (CEFTIN) 500 MG tablet    Sig: Take 1 tablet (500 mg total) by mouth 2 (two) times daily with a meal.    Dispense:  14 tablet    Refill:  0   fluconazole (DIFLUCAN) 200 MG tablet    Sig: Take 1 tablet (200 mg total) by mouth every 3 (three) days.    Dispense:  3 tablet    Refill:  0   LO LOESTRIN FE 1 MG-10 MCG / 10 MCG tablet    Sig: Take 1 tablet by mouth daily. Start taking pill on the first day of period.    Dispense:  28 tablet    Refill:  11    Submit other coverage code 3  BIN:  [id number]  PCN:  [id number]   GRP:  [id number]   ID:  [id number]      Brock Bad, MD 06/24/2021 11:01 AM

## 2021-06-24 NOTE — Progress Notes (Signed)
Pt states she is having vaginal itching and urinary symptoms. Pt is having some urinary leaking.   Pt is sexually active and interested in [facility] options.

## 2021-06-26 LAB — URINE CULTURE

## 2021-06-29 LAB — CERVICOVAGINAL ANCILLARY ONLY
Bacterial Vaginitis (gardnerella): NEGATIVE
Candida Glabrata: NEGATIVE
Candida Vaginitis: POSITIVE — AB
Chlamydia: NEGATIVE
Comment: NEGATIVE
Comment: NEGATIVE
Comment: NEGATIVE
Comment: NEGATIVE
Comment: NEGATIVE
Comment: NORMAL
Neisseria Gonorrhea: NEGATIVE
Trichomonas: NEGATIVE

## 2021-06-30 ENCOUNTER — Other Ambulatory Visit: Payer: Self-pay | Admitting: Obstetrics

## 2021-09-25 ENCOUNTER — Encounter (HOSPITAL_BASED_OUTPATIENT_CLINIC_OR_DEPARTMENT_OTHER): Payer: Self-pay | Admitting: Emergency Medicine

## 2021-09-25 ENCOUNTER — Emergency Department (HOSPITAL_BASED_OUTPATIENT_CLINIC_OR_DEPARTMENT_OTHER)
Admission: EM | Admit: 2021-09-25 | Discharge: 2021-09-25 | Disposition: A | Discharge status: Home / Self Care | Payer: Medicaid Other | Attending: Emergency Medicine | Admitting: Emergency Medicine

## 2021-09-25 DIAGNOSIS — F1190 Opioid use, unspecified, uncomplicated: Secondary | ICD-10-CM

## 2021-09-25 DIAGNOSIS — R1013 Epigastric pain: Secondary | ICD-10-CM | POA: Diagnosis present

## 2021-09-25 DIAGNOSIS — R1011 Right upper quadrant pain: Secondary | ICD-10-CM | POA: Insufficient documentation

## 2021-09-25 DIAGNOSIS — R112 Nausea with vomiting, unspecified: Secondary | ICD-10-CM | POA: Diagnosis not present

## 2021-09-25 DIAGNOSIS — R8289 Other abnormal findings on cytological and histological examination of urine: Secondary | ICD-10-CM | POA: Diagnosis not present

## 2021-09-25 DIAGNOSIS — E876 Hypokalemia: Secondary | ICD-10-CM | POA: Insufficient documentation

## 2021-09-25 DIAGNOSIS — F1290 Cannabis use, unspecified, uncomplicated: Secondary | ICD-10-CM

## 2021-09-25 LAB — URINALYSIS, ROUTINE W REFLEX MICROSCOPIC
Glucose, UA: NEGATIVE mg/dL
Ketones, ur: >=80 mg/dL — AB
Leukocytes,Ua: NEGATIVE
Nitrite: NEGATIVE
Protein, ur: 100 mg/dL — AB
Specific Gravity, Urine: >=1.03 (ref 1.005–1.030)
pH: 6 (ref 5.0–8.0)

## 2021-09-25 LAB — URINALYSIS, MICROSCOPIC (REFLEX)

## 2021-09-25 LAB — COMPREHENSIVE METABOLIC PANEL
ALT: 20 U/L (ref 0–44)
AST: 25 U/L (ref 15–41)
Albumin: 4.3 g/dL (ref 3.5–5.0)
Alkaline Phosphatase: 47 U/L (ref 38–126)
Anion gap: 12 (ref 5–15)
BUN: 13 mg/dL (ref 6–20)
CO2: 23 mmol/L (ref 22–32)
Calcium: 9.3 mg/dL (ref 8.9–10.3)
Chloride: 103 mmol/L (ref 98–111)
Creatinine, Ser: 1.05 mg/dL — ABNORMAL HIGH (ref 0.44–1.00)
GFR, Estimated: >60 mL/min (ref >60)
Glucose, Bld: 87 mg/dL (ref 70–99)
Potassium: 3.2 mmol/L — ABNORMAL LOW (ref 3.5–5.1)
Sodium: 138 mmol/L (ref 135–145)
Total Bilirubin: 1.3 mg/dL — ABNORMAL HIGH (ref 0.3–1.2)
Total Protein: 7.9 g/dL (ref 6.5–8.1)

## 2021-09-25 LAB — LIPASE, BLOOD: Lipase: 40 U/L (ref 11–51)

## 2021-09-25 LAB — RAPID URINE DRUG SCREEN, HOSP PERFORMED
Amphetamines: NOT DETECTED
Barbiturates: NOT DETECTED
Benzodiazepines: NOT DETECTED
Cocaine: NOT DETECTED
Opiates: POSITIVE — AB
Tetrahydrocannabinol: POSITIVE — AB

## 2021-09-25 LAB — CBC WITH DIFFERENTIAL/PLATELET
Abs Immature Granulocytes: 0.03 10*3/uL (ref 0.00–0.07)
Basophils Absolute: 0 10*3/uL (ref 0.0–0.1)
Basophils Relative: 0 %
Eosinophils Absolute: 0 10*3/uL (ref 0.0–0.5)
Eosinophils Relative: 0 %
HCT: 42.5 % (ref 36.0–46.0)
Hemoglobin: 14.6 g/dL (ref 12.0–15.0)
Immature Granulocytes: 0 %
Lymphocytes Relative: 12 %
Lymphs Abs: 1 10*3/uL (ref 0.7–4.0)
MCH: 29.2 pg (ref 26.0–34.0)
MCHC: 34.4 g/dL (ref 30.0–36.0)
MCV: 85 fL (ref 80.0–100.0)
Monocytes Absolute: 0.3 10*3/uL (ref 0.1–1.0)
Monocytes Relative: 3 %
Neutro Abs: 6.9 10*3/uL (ref 1.7–7.7)
Neutrophils Relative %: 85 %
Platelets: 341 10*3/uL (ref 150–400)
RBC: 5 MIL/uL (ref 3.87–5.11)
RDW: 11.9 % (ref 11.5–15.5)
WBC: 8.2 10*3/uL (ref 4.0–10.5)
nRBC: 0 % (ref 0.0–0.2)

## 2021-09-25 LAB — PREGNANCY, URINE: Preg Test, Ur: NEGATIVE

## 2021-09-25 MED ORDER — MORPHINE SULFATE (PF) 4 MG/ML IV SOLN
4.0000 mg | Freq: Once | INTRAVENOUS | Status: AC
  Administered 2021-09-25: 4 mg via INTRAVENOUS
  Filled 2021-09-25: qty 1

## 2021-09-25 MED ORDER — ONDANSETRON HCL 4 MG/2ML IJ SOLN
4.0000 mg | Freq: Once | INTRAMUSCULAR | Status: AC
  Administered 2021-09-25: 4 mg via INTRAVENOUS
  Filled 2021-09-25: qty 2

## 2021-09-25 MED ORDER — ONDANSETRON HCL 4 MG PO TABS: 4.0000 mg | ORAL_TABLET | Freq: Four times a day (QID) | ORAL | 0 refills | Status: AC

## 2021-09-25 MED ORDER — POTASSIUM CHLORIDE CRYS ER 20 MEQ PO TBCR
40.0000 meq | EXTENDED_RELEASE_TABLET | Freq: Once | ORAL | Status: AC
  Administered 2021-09-25: 40 meq via ORAL
  Filled 2021-09-25: qty 2

## 2021-09-25 MED ORDER — METOCLOPRAMIDE HCL 5 MG/ML IJ SOLN
10.0000 mg | Freq: Once | INTRAMUSCULAR | Status: AC
  Administered 2021-09-25: 10 mg via INTRAVENOUS
  Filled 2021-09-25: qty 2

## 2021-09-25 MED ORDER — SODIUM CHLORIDE 0.9 % IV BOLUS
1000.0000 mL | Freq: Once | INTRAVENOUS | Status: AC
  Administered 2021-09-25: 1000 mL via INTRAVENOUS

## 2021-09-25 NOTE — ED Triage Notes (Signed)
Pt reports NV, generalized abd pain x 3d

## 2021-09-25 NOTE — Discharge Instructions (Signed)
Note your work-up today was overall reassuring.  Your UDS tested positive for both opiates and THC.  Try to refrain from ingesting or smoking anything you are unaware of its contents.  I will put in referral information for gastroenterology to follow-up with if your symptoms continue once you intentionally abstain from both substances and if your symptoms continue.  Please do not hesitate to return to the emergency department for worrisome signs and symptoms we discussed become apparent.

## 2021-09-25 NOTE — ED Notes (Signed)
Per EDP order, pt given fluids and/or food for PO challenge. Pt verbalized understanding to utilize call bell if nausea or emesis occur. 

## 2021-09-25 NOTE — ED Notes (Signed)
Pt discharged to home. Discharge instructions have been discussed with patient and/or family members. Pt verbally acknowledges understanding d/c instructions, and endorses comprehension to checkout at registration before leaving.  °

## 2021-09-25 NOTE — ED Notes (Signed)
Pt states unable to obtain urine sample at this time. Call bell within reach

## 2021-09-25 NOTE — ED Provider Notes (Signed)
[facility] EMERGENCY DEPARTMENT Provider Note   CSN: [id number] Arrival date & time: 09/25/21  1415     History  Chief Complaint  Patient presents with   Emesis    Judy Daniel is a 21 y.o. female.   Emesis   21 year old female presents emergency department 3-day history of nausea, vomiting and abdominal pain.  She states that symptoms began insidiously.  She states that symptoms are worsened with food and relieved with rest.  She states that pain feels crampy in nature and is located in her epigastric/right upper quadrant region.  She denies the pain radiating anywhere.  She denies alcohol use, marijuana use or drug use.  Denies fever, chills, night sweats, chest pain, shortness of breath, hematemesis, urinary/vaginal symptoms, change in bowel habits.  Patient states that she began her menstrual cycle 3 days ago but is complaining of no lower abdominal/pelvic pain at this time or abnormal vaginal bleeding.  No significant past medical history Home Medications Prior to Admission medications   Medication Sig Start Date End Date Taking? Authorizing Provider  ondansetron (ZOFRAN) 4 MG tablet Take 1 tablet (4 mg total) by mouth every 6 (six) hours. 09/25/21  Yes Sherian Maroon A, PA  cefUROXime (CEFTIN) 500 MG tablet Take 1 tablet (500 mg total) by mouth 2 (two) times daily with a meal. 06/24/21   Brock Bad, MD  COVID-19 Ad26 vaccine, JANSSEN/J&J, 0.5 ML injection Inject into the muscle. 11/10/20   Judyann Munson, MD  fluconazole (DIFLUCAN) 200 MG tablet Take 1 tablet (200 mg total) by mouth every 3 (three) days. 06/24/21   Brock Bad, MD  LO LOESTRIN FE 1 MG-10 MCG / 10 MCG tablet Take 1 tablet by mouth daily. Start taking pill on the first day of period. 06/24/21   Brock Bad, MD  potassium chloride SA (KLOR-CON M) 20 MEQ tablet Take 2 tablets (40 mEq total) by mouth 2 (two) times daily for 7 days. 06/13/21 06/20/21  Mesner, Barbara Cower, MD       Allergies    Sulfa antibiotics and Sulfanilamide    Review of Systems   Review of Systems  Gastrointestinal:  Positive for vomiting.  All other systems reviewed and are negative.   Physical Exam Updated Vital Signs BP 121/79   Pulse (!) 51   Temp 98.4 F (36.9 C) (Oral)   Resp 18   Ht 5\' 2"  (1.575 m)   Wt 54 kg   LMP 09/23/2021   SpO2 100%   BMI 21.77 kg/m  Physical Exam Vitals and nursing note reviewed.  Constitutional:      General: She is not in acute distress.    Appearance: She is well-developed.  HENT:     Head: Normocephalic and atraumatic.  Eyes:     Conjunctiva/sclera: Conjunctivae normal.  Cardiovascular:     Rate and Rhythm: Normal rate and regular rhythm.     Heart sounds: No murmur heard. Pulmonary:     Effort: Pulmonary effort is normal. No respiratory distress.     Breath sounds: Normal breath sounds.  Abdominal:     General: Abdomen is flat. There is no distension.     Palpations: Abdomen is soft.     Tenderness: There is abdominal tenderness in the epigastric area. There is no right CVA tenderness, left CVA tenderness or rebound.  Musculoskeletal:        General: No swelling.     Cervical back: Neck supple.  Skin:    General: Skin  is warm and dry.     Capillary Refill: Capillary refill takes less than 2 seconds.  Neurological:     Mental Status: She is alert.  Psychiatric:        Mood and Affect: Mood normal.     ED Results / Procedures / Treatments   Labs (all labs ordered are listed, but only abnormal results are displayed) Labs Reviewed  COMPREHENSIVE METABOLIC PANEL - Abnormal; Notable for the following components:      Result Value   Potassium 3.2 (*)    Creatinine, Ser 1.05 (*)    Total Bilirubin 1.3 (*)    All other components within normal limits  URINALYSIS, ROUTINE W REFLEX MICROSCOPIC - Abnormal; Notable for the following components:   Color, Urine AMBER (*)    APPearance HAZY (*)    Hgb urine dipstick LARGE (*)     Bilirubin Urine SMALL (*)    Ketones, ur >=80 (*)    Protein, ur 100 (*)    All other components within normal limits  RAPID URINE DRUG SCREEN, HOSP PERFORMED - Abnormal; Notable for the following components:   Opiates POSITIVE (*)    Tetrahydrocannabinol POSITIVE (*)    All other components within normal limits  URINALYSIS, MICROSCOPIC (REFLEX) - Abnormal; Notable for the following components:   Bacteria, UA MANY (*)    All other components within normal limits  CBC WITH DIFFERENTIAL/PLATELET  LIPASE, BLOOD  PREGNANCY, URINE    EKG None  Radiology No results found.  Procedures Procedures    Medications Ordered in ED Medications  sodium chloride 0.9 % bolus 1,000 mL (1,000 mLs Intravenous New Bag/Given 09/25/21 1521)  ondansetron (ZOFRAN) injection 4 mg (4 mg Intravenous Given 09/25/21 1518)  morphine (PF) 4 MG/ML injection 4 mg (4 mg Intravenous Given 09/25/21 1518)  potassium chloride SA (KLOR-CON M) CR tablet 40 mEq (40 mEq Oral Given 09/25/21 1609)  metoCLOPramide (REGLAN) injection 10 mg (10 mg Intravenous Given 09/25/21 1623)    ED Course/ Medical Decision Making/ A&P                           Medical Decision Making Amount and/or Complexity of Data Reviewed Labs: ordered. Radiology: ordered.  Risk Prescription drug management.   This patient presents to the ED for concern of abdominal pain, this involves an extensive number of treatment options, and is a complaint that carries with it a high risk of complications and morbidity.  The differential diagnosis includes cholecystitis, hepatitis, pancreatitis, small bowel obstruction, gastritis, gastroenteritis, DKA, UTI, choledocholithiasis, appendicitis   Co morbidities that complicate the patient evaluation  See HPI   Additional history obtained:  Additional history obtained from EMR External records from outside source obtained and reviewed including prior CT abdomen pelvis from 06/11/2021, 04/05/2021 and  04/01/2021   Lab Tests:  I Ordered, and personally interpreted labs.  The pertinent results include: No leukocytosis noted.  No evidence of anemia.  Platelets within normal range.  Potassium 3.2 which is supplemented with oral potassium.  Electrolytes otherwise within normal range.  Renal function within normal range.  No transaminitis noted.  UDS positive for opiates as well as THC.  Patient still declines consciously using both but states her fianc smokes marijuana all the time.  Urinalysis significant for many bacteria, 6-10 RBC with no nitrites or leukocytes.  Patient not actively symptomatic so will not treat for urinary tract infection.  UA also showed greater than 80 ketones, 100  protein and large hemoglobin.   Imaging Studies ordered:  N/a   Cardiac Monitoring: / EKG:  The patient was maintained on a cardiac monitor.  I personally viewed and interpreted the cardiac monitored which showed an underlying rhythm of: Sinus rhythm   Consultations Obtained:  N/a   Problem List / ED Course / Critical interventions / Medication management  Epigastric pain, nausea, vomiting I ordered medication including 1 L normal saline for rehydration, Zofran for nausea/vomiting, Reglan for nausea/vomiting, morphine for pain   Reevaluation of the patient after these medicines showed that the patient improved I have reviewed the patients home medicines and have made adjustments as needed   Social Determinants of Health:  Patient denies illicit drug use, marijuana, tobacco use.   Test / Admission - Considered:  Vitals signs within normal range and stable throughout visit. Laboratory studies significant for: See above.  Imaging studies considered but deemed unnecessary at this time due to multiple imagings obtained while emergency department in the past 6 months with no acute findings.  Patient has presented with the same so UDS was performed which showed positive for both THC and opiates.  Upon  reevaluation of the patient after medications and fluids have been running, patient feeling back to baseline.  She states that she still unaware of intentional use of either THC or opiates at home.  She states that her fianc smokes marijuana around her frequently but states she has not intentionally used herself.  Patient was educated regarding correlation of symptoms with opiate withdrawal as well as THC use.  She was advised to refrain from either upon discharge.  Patient tolerated p.o. while in emergency department.  Symptomatic therapy with Zofran to be given upon discharge.  Close follow-up with GI recommended when she abstains if symptoms continue.  Treatment plan were discussed at length with patient and they knowledge understanding was agreeable to said plan.  Worrisome signs and symptoms were discussed with the patient and the patient knowledge understanding was agreeable.  Patient was stable upon discharge.        Final Clinical Impression(s) / ED Diagnoses Final diagnoses:  Epigastric pain  Nausea and vomiting, unspecified vomiting type    Rx / DC Orders ED Discharge Orders          Ordered    ondansetron (ZOFRAN) 4 MG tablet  Every 6 hours        09/25/21 1720              Peter Garter, [place] 09/25/21 Nigel Sloop, MD 09/25/21 [phone]

## 2021-11-15 ENCOUNTER — Telehealth: Payer: Medicaid Other | Admitting: Emergency Medicine

## 2021-11-15 DIAGNOSIS — N760 Acute vaginitis: Secondary | ICD-10-CM

## 2021-11-15 MED ORDER — METRONIDAZOLE 500 MG PO TABS: 500.0000 mg | ORAL_TABLET | Freq: Two times a day (BID) | ORAL | 0 refills | Status: AC

## 2021-11-15 NOTE — Progress Notes (Signed)

## 2023-04-01 IMAGING — CT CT ABD-PELV W/ CM
2 of 4 series · 16 of 46 positions shown, 18 images · IV contrast (agent unspecified)
Comparison: 04/01/2021

CLINICAL DATA: Pain upper abdomen

EXAM:
CT ABDOMEN AND PELVIS WITH CONTRAST
TECHNIQUE: Multidetector CT imaging of the abdomen and pelvis was performed
using the standard protocol following bolus administration of
intravenous contrast.

[Series 2: axial st · axial · 0.59mm/px · z∈[-430,-90]mm · 13 of 78 slices shown, 15 images]
[im 5/78  soft-tissue]
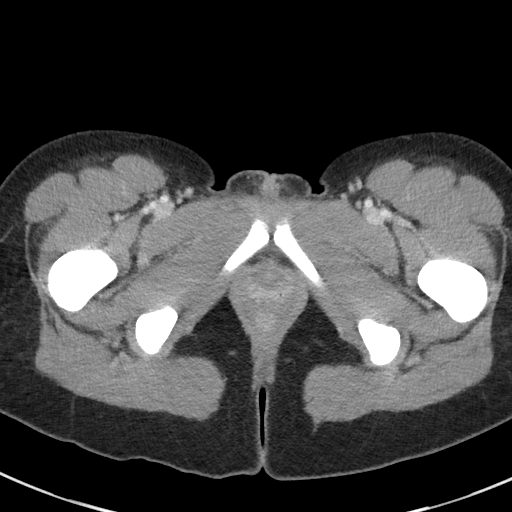
[im 5/78  bone]
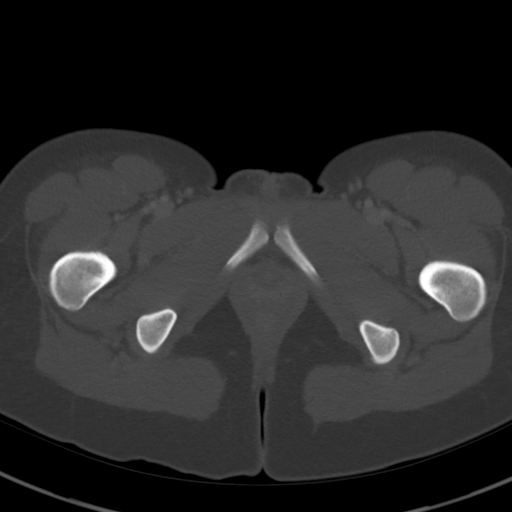
[im 13/78  soft-tissue]
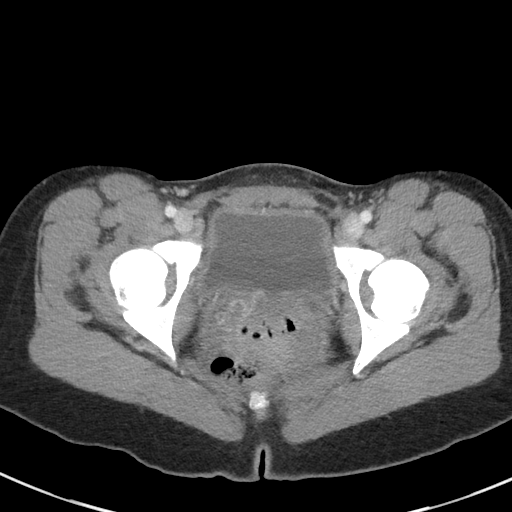
[im 17/78  soft-tissue]
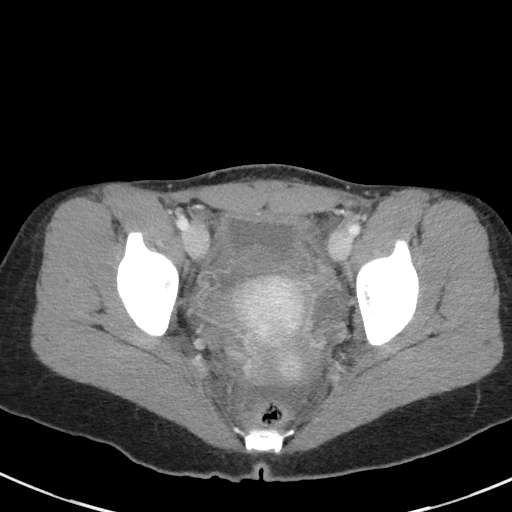
[im 21/78  soft-tissue]
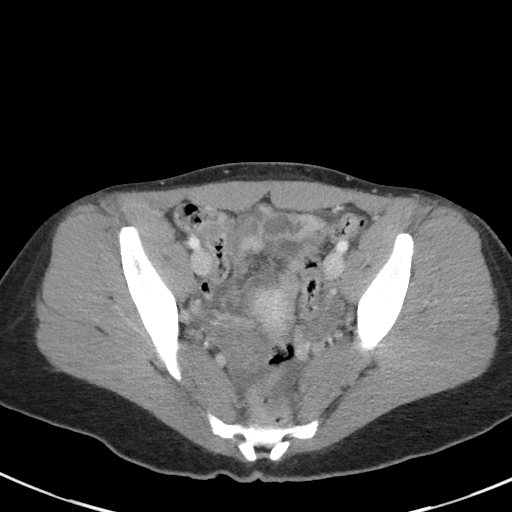
[im 29/78  soft-tissue]
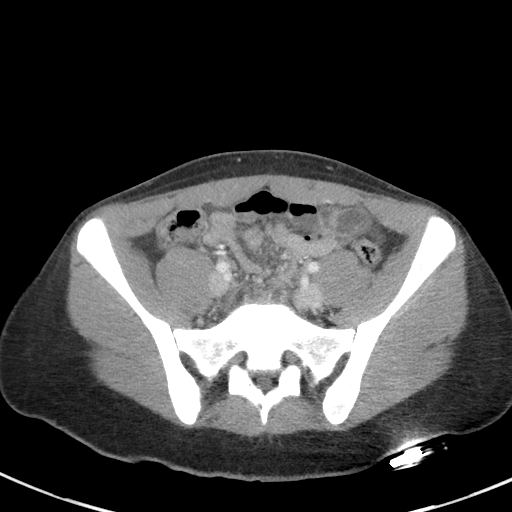
[im 33/78  soft-tissue]
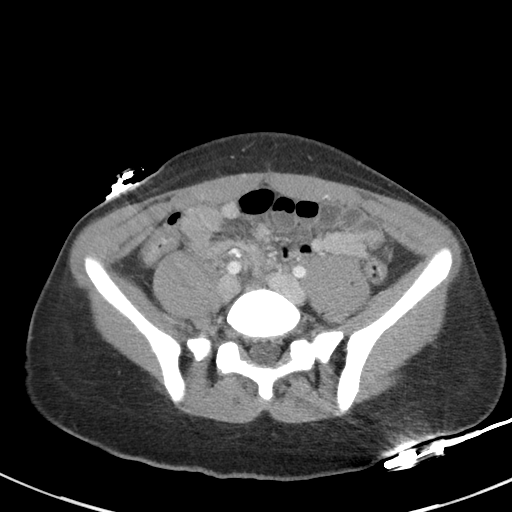
[im 41/78  soft-tissue]
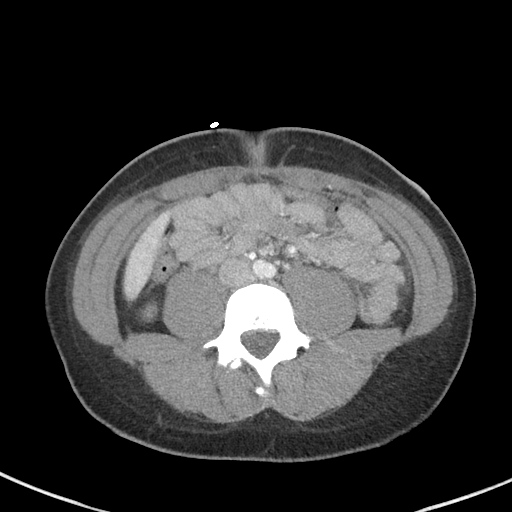
[im 45/78  soft-tissue]
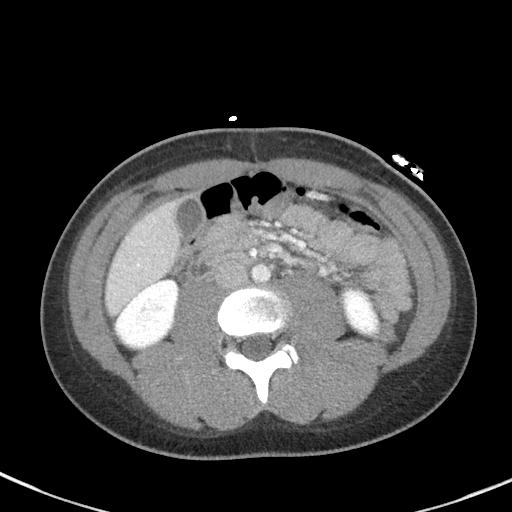
[im 49/78  soft-tissue]
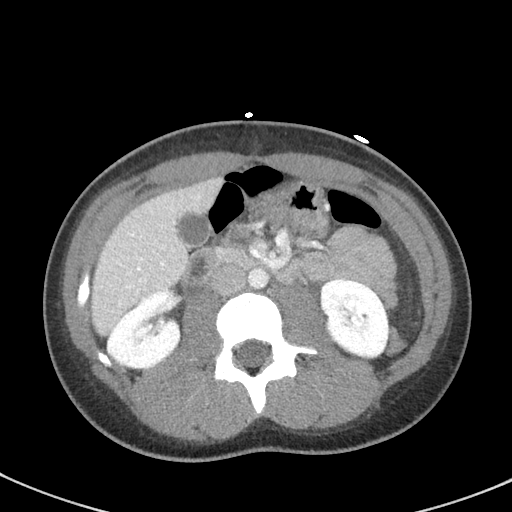
[im 49/78  bone]
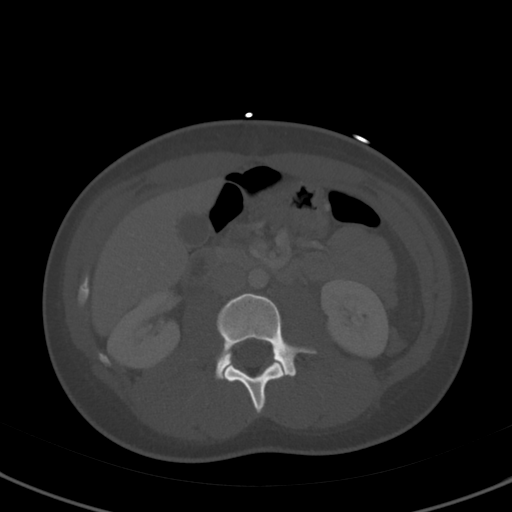
[im 57/78  soft-tissue]
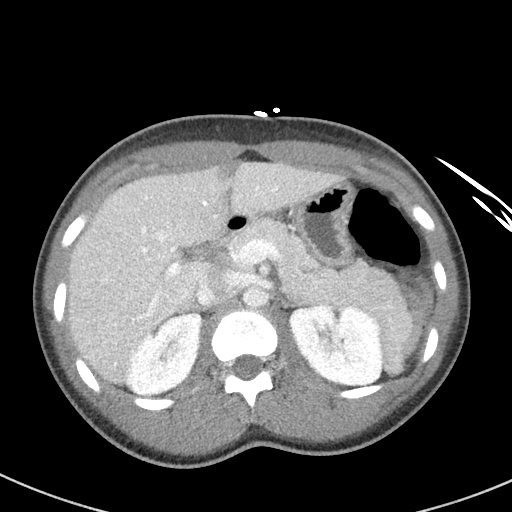
[im 61/78  soft-tissue]
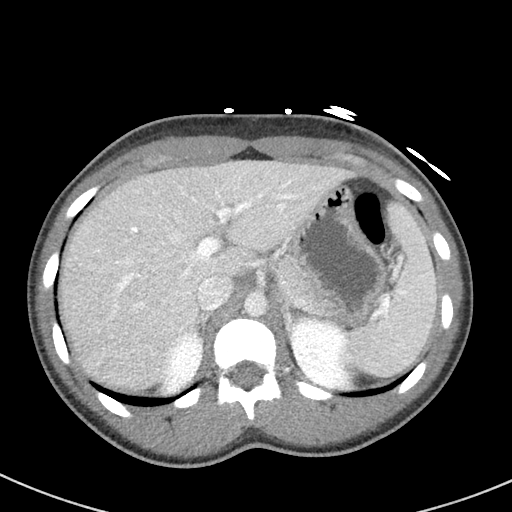
[im 65/78  soft-tissue]
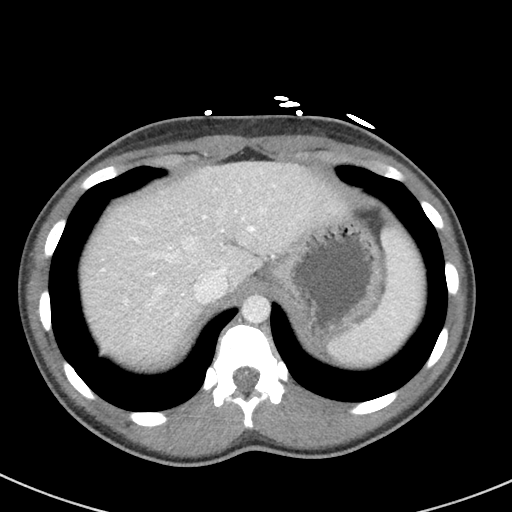
[im 73/78  soft-tissue]
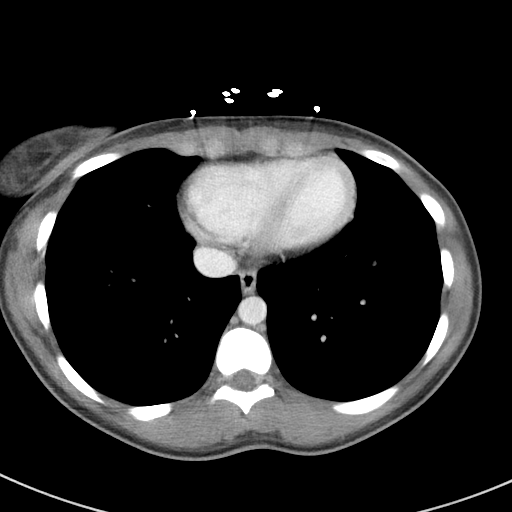

[Series 4: coronal st · coronal · 0.69mm/px · 3 of 67 slices shown]
[im 23/67  soft-tissue]
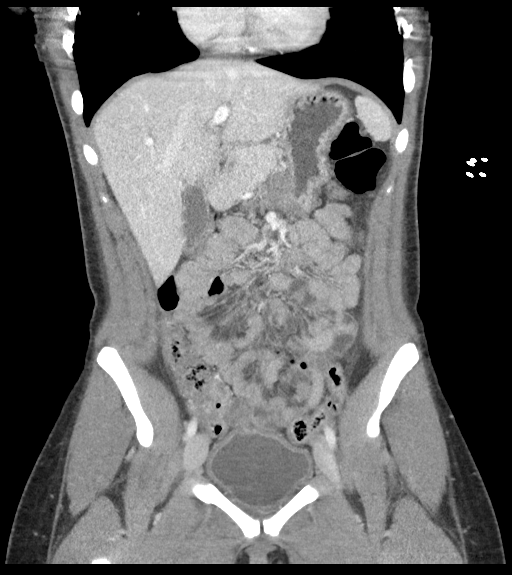
[im 30/67  soft-tissue]
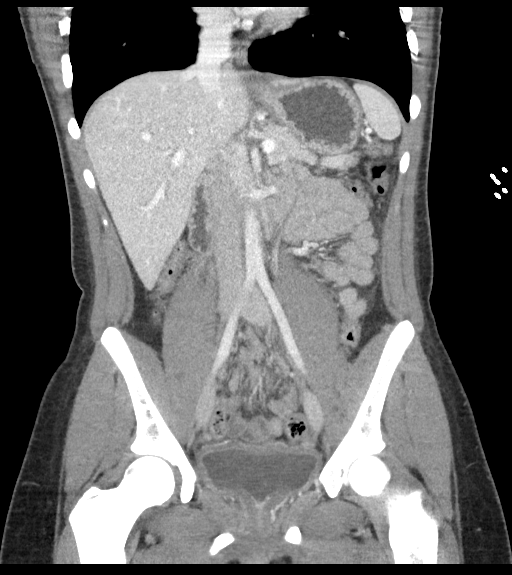
[im 37/67  soft-tissue]
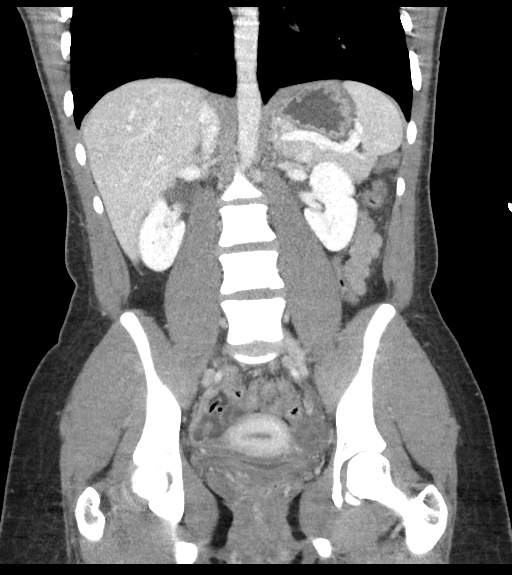

[16 of 46 positions shown; findings below may reference images not displayed]

RADIATION DOSE REDUCTION: This exam was performed according to the
departmental dose-optimization program which includes automated
exposure control, adjustment of the mA and/or kV according to
patient size and/or use of iterative reconstruction technique.

CONTRAST:  100mL OMNIPAQUE IOHEXOL 300 MG/ML  SOLN
FINDINGS: Lower chest: Motion artifacts limit evaluation of lower lung fields.
As far as seen, no focal pulmonary infiltrates are seen in the lower
lung fields.

Hepatobiliary: Liver measures 15 cm in length. No focal abnormality
is seen. Gallbladder is unremarkable.

Pancreas: No focal abnormality is seen.

Spleen: Unremarkable.

Adrenals/Urinary Tract: Adrenals are not enlarged. There is no
hydronephrosis. There is early excretion of contrast into the
collecting systems limiting evaluation for tiny renal stones.
Ureters are not dilated. There is mild diffuse wall thickening in
the urinary bladder.

Stomach/Bowel: Stomach is unremarkable. Small bowel loops are not
dilated. Appendix is not dilated. Appendix is lower than usual in
the right pelvic cavity. There is no significant wall thickening in
colon. There is no pericolic stranding.

Vascular/Lymphatic: Unremarkable.

Reproductive: There is small to moderate amount of serous fluid in
cul-de-sac. No dominant adnexal masses are seen.

Other: There is no pneumoperitoneum.

Musculoskeletal: Unremarkable.
IMPRESSION: There is no evidence of intestinal obstruction or pneumoperitoneum.
There is no hydronephrosis. Appendix is not dilated.

There is mild diffuse wall thickening in the urinary bladder. This
may suggest cystitis or an artifact due to incomplete distention.

Small to moderate amount of serous free fluid is seen in the
cul-de-sac in the pelvis, most likely suggesting physiological
rupture of ovarian cyst or follicle.

## 2023-04-22 NOTE — ED Provider Notes (Signed)
 Patient placed in [facility] pathway, seen and evaluated for chief complaint of nausea, vomiting, diarrhea, generalized abdominal pain onset 2 days..  Pertinent exam findings include ill but not toxic. Based on initial evaluation, labs are currently indicated and radiology studies are not currently indicated as allowed for current processes and treatments as applicable in a triage setting and could be different than if patient were seen in a main treatment area or dependent on labs/imagining after results are displayed.  Patient counseled on process, plan, and necessity for staying for completing the evaluation.   This document serves as a record of services personally performed by Fonda Sheffield PA-C.  The creation of this record is the provider's dictation and/or activities during the visit.    Note By: Fonda Sheffield, PA-C 1:13 PM     [facility] EMERGENCY DEPARTMENT  History   Chief Complaint: Emesis.  History of Present Illness:  History obtained from: Patient  Judy Daniel is a 23 y.o. female who presents to the ED with complaints of emesis, onset a few days ago. Patient reports associated nausea, diarrhea, and generalized abdominal pain. Patient reports history of same in 2023 associated with marijuana use, though states she has not used in 1-2 months. Patient denies fever, chest pain, shortness of breath, or any other medical complaints.   ______________________ ROS: Pertinent positives and negatives per HPI. Pertinent past medical, surgical, social and family history records were reviewed. Current Medications and Allergies were reviewed.  Physical Exam   Vitals:   04/22/23 1310 04/22/23 1640 04/22/23 1945 04/22/23 1954  BP: 122/80 94/62 127/64   BP Location: Right arm Left arm Right arm   Patient Position: Sitting Sitting Sitting   Pulse: 59 56 60   Resp: (!) 21 (!) 22 15 16   Temp: 98 F (36.7 C) 98.2 F (36.8 C)    TempSrc: Oral Oral    SpO2:  100% 99% 96%   Weight: 60.6 kg (133 lb 9.6 oz)     Height: 157.5 cm (5' 2)         Physical Exam Vitals and nursing note reviewed.  Constitutional:      General: She is not in acute distress.    Appearance: She is not toxic-appearing or diaphoretic.  HENT:     Head: Normocephalic and atraumatic.     Right Ear: External ear normal.     Left Ear: External ear normal.     Nose: Nose normal.     Mouth/Throat:     Mouth: Mucous membranes are moist.     Pharynx: Oropharynx is clear.  Eyes:     Extraocular Movements: Extraocular movements intact.     Conjunctiva/sclera: Conjunctivae normal.     Pupils: Pupils are equal, round, and reactive to light.  Cardiovascular:     Rate and Rhythm: Normal rate and regular rhythm.     Pulses: Normal pulses.     Heart sounds: Normal heart sounds.  Pulmonary:     Effort: Pulmonary effort is normal. No respiratory distress.     Breath sounds: Normal breath sounds. No stridor. No wheezing, rhonchi or rales.  Abdominal:     General: Abdomen is flat. Bowel sounds are normal. There is no distension.     Palpations: Abdomen is soft.     Tenderness: There is generalized abdominal tenderness. There is no guarding or rebound.  Musculoskeletal:        General: No signs of injury.     Cervical back: Normal range  of motion. No rigidity or tenderness.     Right lower leg: No edema.     Left lower leg: No edema.  Skin:    General: Skin is warm and dry.     Capillary Refill: Capillary refill takes less than 2 seconds.     Findings: No rash.  Neurological:     General: No focal deficit present.     Mental Status: She is alert and oriented to person, place, and time. Mental status is at baseline.     Sensory: No sensory deficit.     Motor: No weakness.     Results    Labs: Labs Reviewed  COMPREHENSIVE METABOLIC PANEL - Abnormal      Result Value   Sodium 139     Potassium 3.9     Chloride 105     CO2 22     Anion Gap 12     Glucose, Random  116 (*)    Blood Urea Nitrogen (BUN) 6 (*)    Creatinine 0.90     eGFR >90     Albumin 4.4     Total Protein 7.4     Bilirubin, Total 0.6     Alkaline Phosphatase (ALP) 55     Aspartate Aminotransferase (AST) 15     Alanine Aminotransferase (ALT) 9     Calcium 9.1     BUN/Creatinine Ratio      URINALYSIS WITH REFLEX TO MICROSCOPIC - Abnormal   Color, Urine Yellow     Clarity, Urine Clear     Specific Gravity, Urine 1.025     pH, Urine 8.5 (*)    Protein, Urine 50 (*)    Glucose, Urine Negative     Ketones, Urine 20 (*)    Bilirubin, Urine Negative     Blood, Urine Negative     Nitrite, Urine Negative     Leukocyte Esterase, Urine Negative     Urobilinogen, Urine Normal     WBC, Urine 0-5     RBC, Urine 3-5 (*)    Bacteria, Urine None Seen     Squamous Epithelial Cells, Urine 0-5    CBC WITH DIFFERENTIAL - Abnormal   WBC 9.10     RBC 4.94     Hemoglobin 14.6     Hematocrit 43.1     Mean Corpuscular Volume (MCV) 87.2     Mean Corpuscular Hemoglobin (MCH) 29.5     Mean Corpuscular Hemoglobin Conc (MCHC) 33.8     Red Cell Distribution Width (RDW) 13.6     Platelet Count (PLT) 356     Mean Platelet Volume (MPV) 8.7     Neutrophils % 73     Lymphocytes % 17     Monocytes % 3     Eosinophils % 6     Basophils % 1     Neutrophils Absolute 6.60     Lymphocytes # 1.60     Monocytes # 0.30     Eosinophils # 0.60 (*)    Basophils # 0.10    LIPASE - Normal   Lipase 34    HUMAN CHORIONIC GONADOTROPIN  (HCG), QUANTITATIVE - Normal   Human Chorionic Gonadotropin  (HCG), Quantitative <1    SARS-COV-2, FLU, AND RSV, QUALITATIVE NAAT - Normal   SARS-CoV-2 Negative     Influenza A Negative     Influenza B Negative     RSV Negative     Narrative:    Results are for use in the simultaneous  rapid in vitro detection and differentiation of SARS-CoV-2, RSV, influenza A virus, and influenza B virus nucleic acids by PCR in clinical specimens.   Positive results are indicative of  active infection but do not rule out bacterial infection or co-infection with other pathogens not detected by the test. Clinical correlation with patient history and other diagnostic information is necessary to determine patient infection status. The agent detected may not be the definite cause of disease.   Negative results do not preclude SARS-CoV-2, RSV, influenza A, and/or influenza B infection and should not be used as the sole basis for diagnosis, treatment or other patient management decisions. Negative results must be combined with clinical observations, patient history, and/or epidemiological information.   Test performed by [facility] qualified personnel using the Cepheid Xpert SARS-CoV-2 & Influenza A/B Nucleic acid test on the GeneXpert instrument. This test is only for use under the Food and Drug Administration's Emergency Use Authorization (EUA).  POC HCG QUALITATIVE, URINE (AH)   HCG, Urine, POC Negative     Internal Control Acceptable     Kit/Device Lot # [id number]     Kit/Device Expiration Date 1687973      ED Medications Administered: Medications  ondansetron  (ZOFRAN -ODT) disintegrating tablet 4 mg (4 mg oral Given 04/22/23 1315)  ondansetron  (ZOFRAN ) injection 4 mg (4 mg intravenous Given 04/22/23 1737)  lactated ringer 's (bolus) bolus 1,000 mL (0 mL intravenous Stopped 04/22/23 2011)  droPERidol  (INAPSINE ) injection 2.5 mg (2.5 mg intravenous Given 04/22/23 1852)  ketorolac  (TORADOL ) injection 15 mg (15 mg intravenous Given 04/22/23 1956)  HYDROcodone-acetaminophen (NORCO) 5-325 mg per tablet 1 tablet (1 tablet oral Given 04/22/23 1954)    ED Course & Medical Decision Making   External records were reviewed: Reviewed [facility] ED visit on 09/25/2021 for epigastric pain, nausea and vomiting, marijuana use, opiate use. _________________________  Judy Daniel is a 23 y.o. female who was seen in the ED for abdominal pain, nausea and vomiting.  The following differentials  were considered: Pancreatitis, gallbladder disease, hepatobiliary dysfunction, PUD, GERD/gastritis, ectopic pregnancy, UTI, viral illness, endocrine or metabolic disturbance.  Pertinent studies were obtained, with results listed in chart above.  Lab work is unremarkable.  While I considered CT abdomen pelvis imaging study, patient had no focal abdominal tenderness, and no laboratory evidence of acute organ dysfunction.  She was given IVF bolus, analgesic and antiemetic medications with adequate symptom relief.  At this time, I have low suspicion for emergent etiology of patient's symptoms.  While I initially considered hospital admission, based on reassuring ED work-up and response to treatment, I feel that patient is appropriate for discharge home with PCP follow-up.  Strict return precautions regarding this ED visit were discussed.   ED Clinical Impression   1. Nausea vomiting and diarrhea   2. Generalized abdominal cramping     ED Disposition   ED Disposition     ED Disposition  Discharge   Condition  Stable   Comment  --         _________________ Scribe's Attestation: Dr. Zackowski obtained and performed the history, physical exam and medical decision making elements that were entered into the chart. Documentation assistance was provided by me personally, a scribe. Signed by Sherryle Hamilton, Scribe on 04/23/2023 9:49 AM  Documentation assistance provided by the scribe. I was present during the time the encounter was recorded. The information recorded by the scribe was done at my direction and has been reviewed and validated by me. William Zackowski, DO  (Please  note that portions of this note have been completed with Conservation officer, historic buildings.  Efforts were made to correct any errors, but occasionally words are mis-transcribed.)

## 2023-04-22 NOTE — ED Triage Notes (Signed)
 Patient reports abdominal with vomiting and diarrhea for the past two days

## 2023-06-07 IMAGING — CT CT ABD-PELV W/ CM
2 of 4 series · 16 of 46 positions shown, 18 images · IV contrast (APPLIED)
Comparison: Ultrasound and CT 04/05/2021

CLINICAL DATA: Nausea vomiting right lower quadrant pain

EXAM:
CT ABDOMEN AND PELVIS WITH CONTRAST
TECHNIQUE: Multidetector CT imaging of the abdomen and pelvis was performed
using the standard protocol following bolus administration of
intravenous contrast.

[Series 3: abd/ pelvis 5.0 i30f 2 · axial · 0.84mm/px · z∈[+457,+822]mm · 13 of 81 slices shown, 15 images]
[im 4/81  soft-tissue]
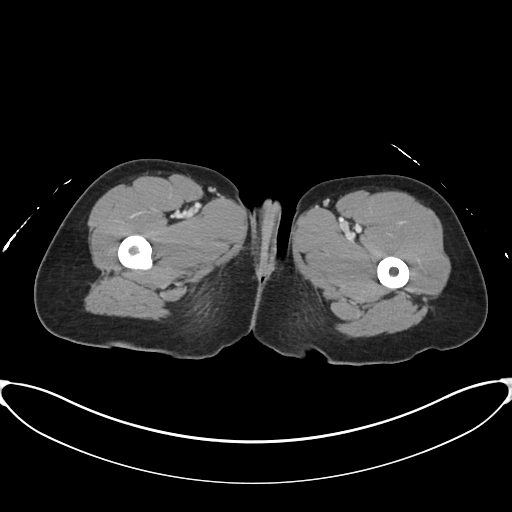
[im 4/81  bone]
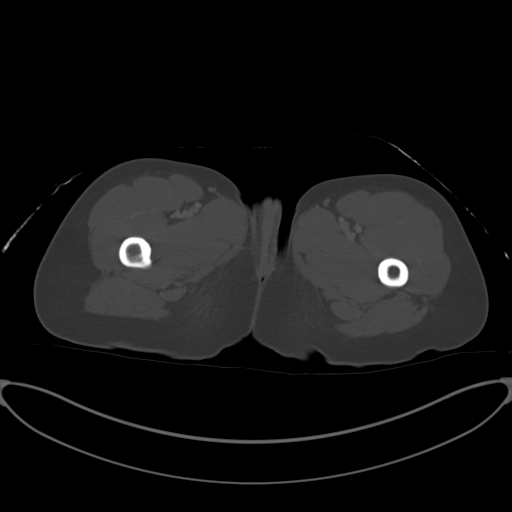
[im 11/81  soft-tissue]
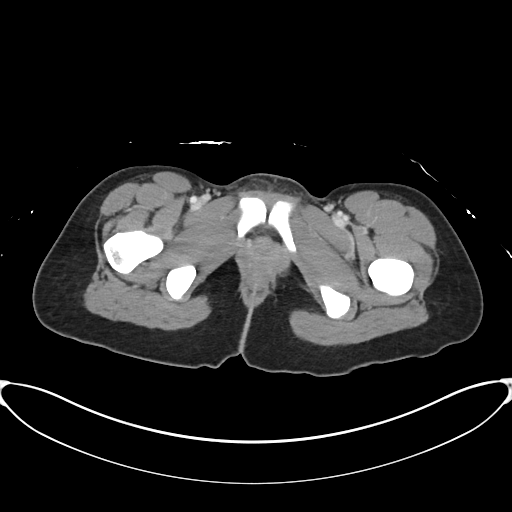
[im 17/81  soft-tissue]
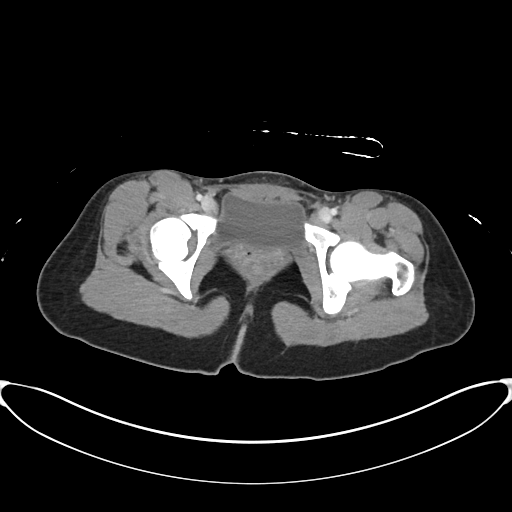
[im 24/81  soft-tissue]
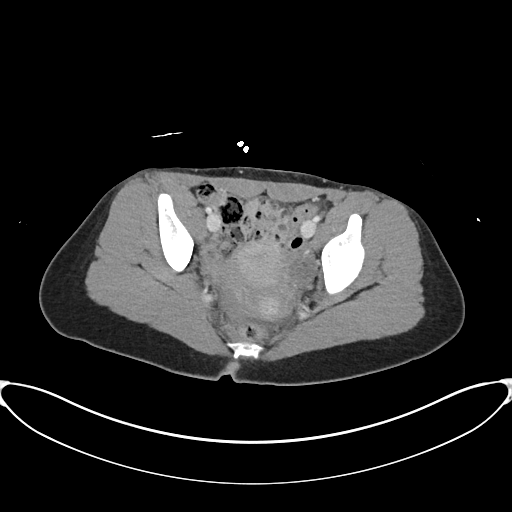
[im 27/81  soft-tissue]
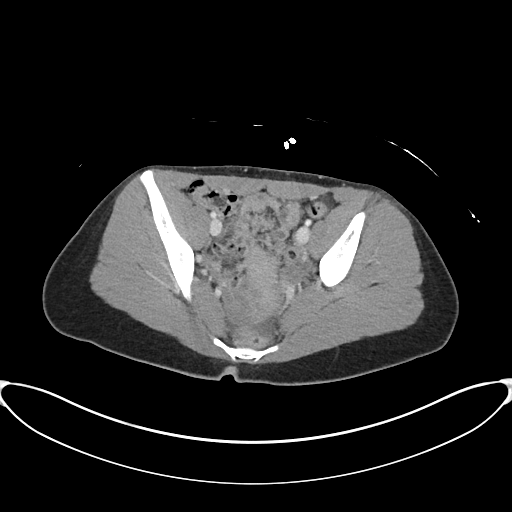
[im 34/81  soft-tissue]
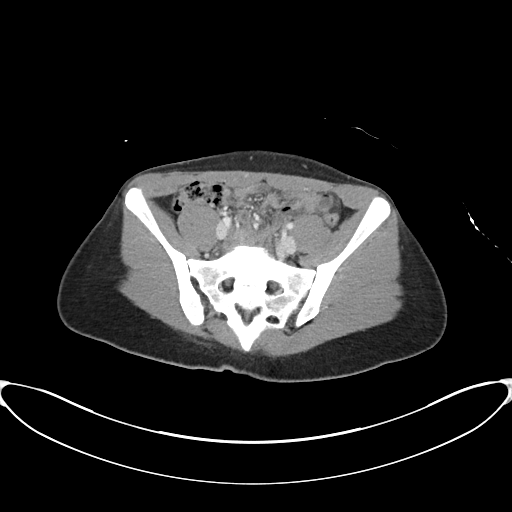
[im 41/81  soft-tissue]
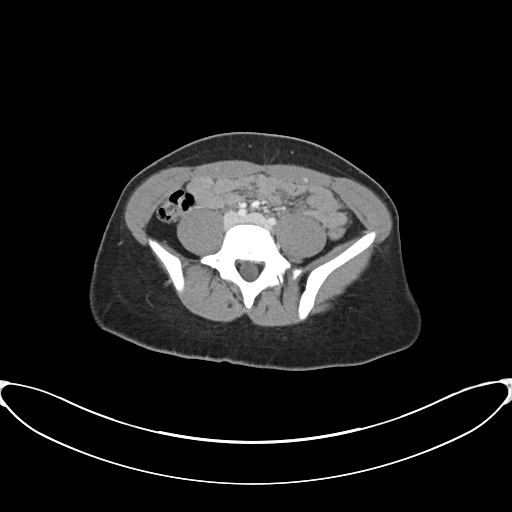
[im 47/81  soft-tissue]
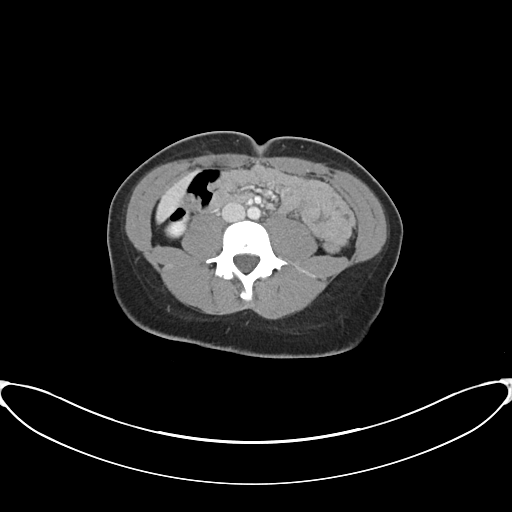
[im 54/81  soft-tissue]
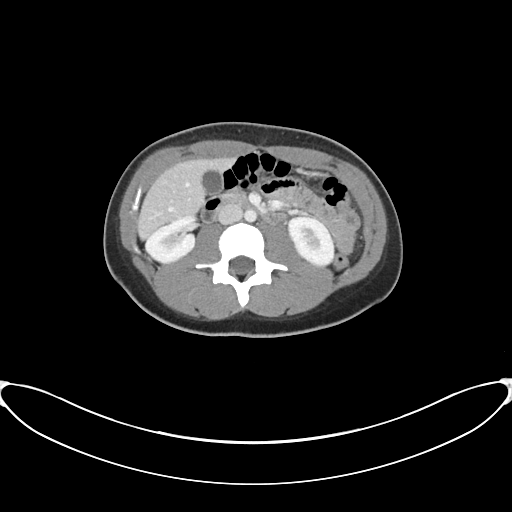
[im 54/81  bone]
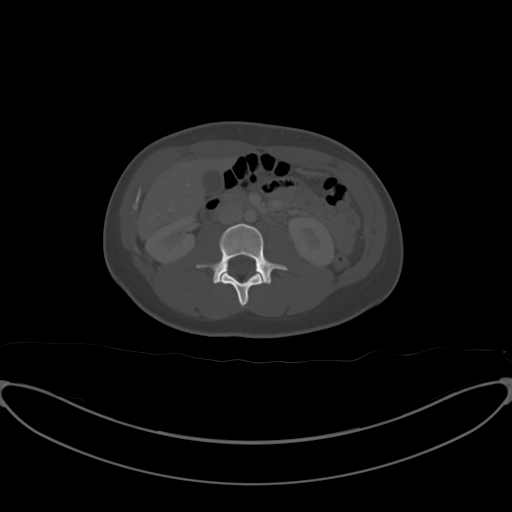
[im 57/81  soft-tissue]
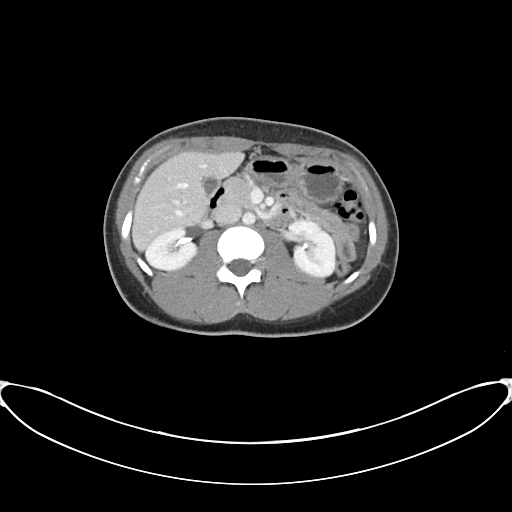
[im 64/81  soft-tissue]
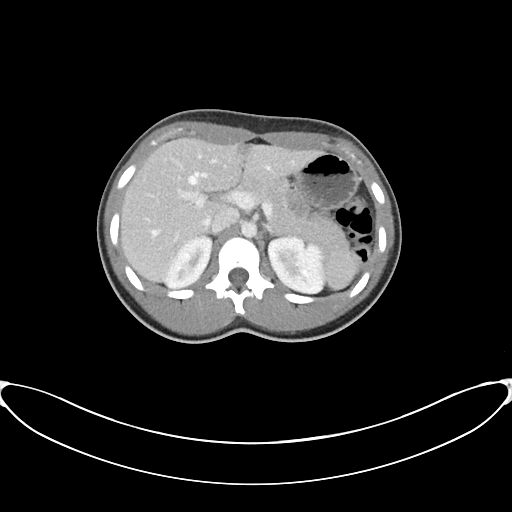
[im 71/81  soft-tissue]
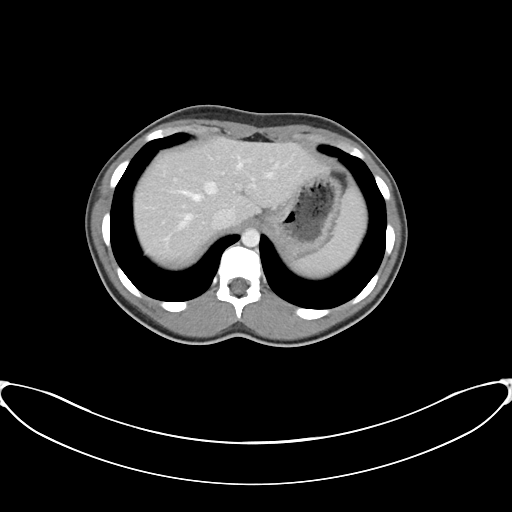
[im 77/81  soft-tissue]
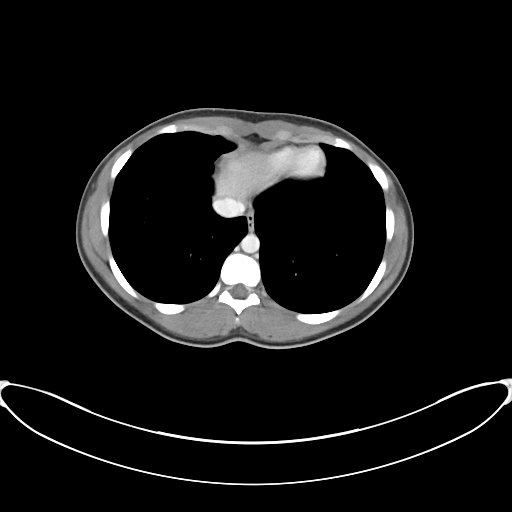

[Series 6: coronal soft tissue · coronal · 0.74mm/px · 3 of 87 slices shown]
[im 29/87  soft-tissue]
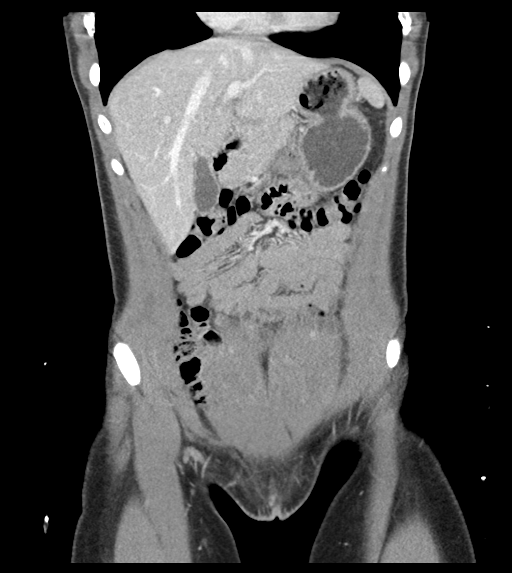
[im 39/87  soft-tissue]
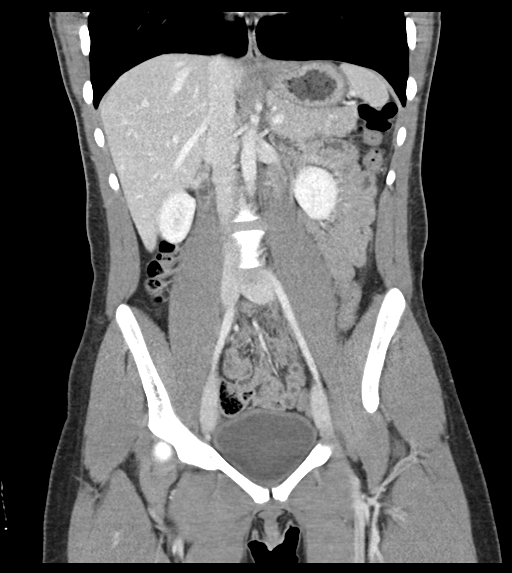
[im 48/87  soft-tissue]
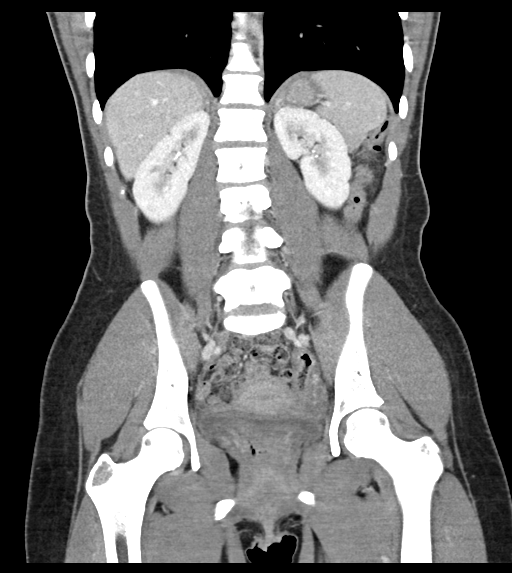

[16 of 46 positions shown; findings below may reference images not displayed]

RADIATION DOSE REDUCTION: This exam was performed according to the
departmental dose-optimization program which includes automated
exposure control, adjustment of the mA and/or kV according to
patient size and/or use of iterative reconstruction technique.

CONTRAST:  70mL OMNIPAQUE IOHEXOL 350 MG/ML SOLN
FINDINGS: Lower chest: No acute abnormality.

Hepatobiliary: No focal liver abnormality is seen. No gallstones,
gallbladder wall thickening, or biliary dilatation.

Pancreas: Unremarkable. No pancreatic ductal dilatation or
surrounding inflammatory changes.

Spleen: Normal in size without focal abnormality.

Adrenals/Urinary Tract: Adrenal glands are unremarkable. Kidneys are
normal, without renal calculi, focal lesion, or hydronephrosis.
Bladder is unremarkable.

Stomach/Bowel: Stomach is within normal limits. Appendix appears
normal. No evidence of bowel wall thickening, distention, or
inflammatory changes.

Vascular/Lymphatic: No significant vascular findings are present. No
enlarged abdominal or pelvic lymph nodes.

Reproductive: Uterus and bilateral adnexa are unremarkable.

Other: Small free fluid in the pelvis.  No free air.

Musculoskeletal: No acute or significant osseous findings.
IMPRESSION: 1. No CT evidence for acute intra-abdominal or pelvic abnormality.
Negative for acute appendicitis
2. Small free fluid in the pelvis

## 2023-08-09 NOTE — Progress Notes (Signed)
 Subjective Patient ID: Judy Daniel is a 23 y.o. female.  Chief Complaint  Patient presents with  . Vomiting  . Nausea    Patients sister states the patient started vomiting after drinking a cocktail and eating tacos at [employer] at about 1pm.     The following information was reviewed by members of the visit team:  Tobacco  Allergies  Meds  Problems  Med Hx  Surg Hx  OB Status   Fam Hx      Vomiting    23 year old female presents with nausea and vomiting. Symptoms started today at 1300. Patient was at [employer], she had one mixed drink, tacos and [place] dip. Patient was with her sisters.No one else is sick. Patient reports abdominal cramping. Vomited 3 times PTA. She has dry heaves at this time. Denies diarrhea. denies fever.   Review of Systems  Gastrointestinal:  Positive for vomiting.  All other systems reviewed and are negative.   Objective Physical Exam Vitals and nursing note reviewed.  Constitutional:      General: She is not in acute distress.    Appearance: Normal appearance. She is not ill-appearing, toxic-appearing or diaphoretic.  HENT:     Head: Normocephalic and atraumatic.     Right Ear: Tympanic membrane, ear canal and external ear normal.     Left Ear: Tympanic membrane, ear canal and external ear normal.     Nose: Nose normal.     Mouth/Throat:     Mouth: Mucous membranes are moist.     Pharynx: No oropharyngeal exudate or posterior oropharyngeal erythema.   Eyes:     Conjunctiva/sclera: Conjunctivae normal.    Cardiovascular:     Rate and Rhythm: Normal rate and regular rhythm.     Heart sounds: No murmur heard. Pulmonary:     Effort: Pulmonary effort is normal. No respiratory distress.     Breath sounds: Normal breath sounds. No stridor. No wheezing, rhonchi or rales.  Abdominal:     General: Abdomen is flat. Bowel sounds are increased.     Palpations: Abdomen is soft.     Tenderness: There is generalized abdominal tenderness.  There is no rebound.   Skin:    General: Skin is warm and dry.   Neurological:     Mental Status: She is alert.   Psychiatric:        Mood and Affect: Mood normal.     Assessment/Plan Diagnoses and all orders for this visit:  Nausea and vomiting, unspecified vomiting type -     ondansetron  (ZOFRAN -ODT) disintegrating tablet 4 mg -     ondansetron  (ZOFRAN ) injection 4 mg  23 year old female presents to the UC with nausea and vomiting that started this afternoon at 1300. Patient has vomited 4 times. Drinking water causes nausea and abdominal pain. Patient requesting something for the pain. Discussed with patient that stopping liquids at this time and remaining NPO will decrease the abdominal pain and vomiting. Patient received zofran  4 mg ODT, vomited soon after administering. Zofran  4 mg given IM. No fever. Denies diarrhea. After Zofran  IM patient was resting with eye closed. Denies bilious vomiting.       Patient presents with symptoms c/w a viral gastroenteritis (vomiting,). Patient appears well. No signs of toxicity, patient is interactive and clinical exam is reassuring.   Not in distress. No signs of clinical dehydration.  Patient is not tolerating PO at this time.  Abdominal exam benign without peritonitis, rebound or guarding. Doubt appendicitis,  bowel obstruction, perforated abdominal viscous and no evidence of any other illness at this time.  Discussed symptomatic treatment. Discussed virus having to run course. Patient reported going to the ED to receive pain medication.  I have alerted them specifically to seek further medical attention for dehydration, marked weakness, fainting, increased abdominal pain, or blood in stool or vomit.     Electronically signed: Lonell Rattler Mabe, FNP 08/09/2023  5:24 PM

## 2023-08-10 ENCOUNTER — Emergency Department (HOSPITAL_COMMUNITY)
Admission: EM | Admit: 2023-08-10 | Discharge: 2023-08-10 | Disposition: A | Discharge status: Home / Self Care | Attending: Emergency Medicine | Admitting: Emergency Medicine

## 2023-08-10 ENCOUNTER — Encounter (HOSPITAL_COMMUNITY): Payer: Self-pay | Admitting: Emergency Medicine

## 2023-08-10 ENCOUNTER — Other Ambulatory Visit: Payer: Self-pay

## 2023-08-10 DIAGNOSIS — Y900 Blood alcohol level of less than 20 mg/100 ml: Secondary | ICD-10-CM | POA: Insufficient documentation

## 2023-08-10 DIAGNOSIS — R1084 Generalized abdominal pain: Secondary | ICD-10-CM | POA: Insufficient documentation

## 2023-08-10 DIAGNOSIS — R112 Nausea with vomiting, unspecified: Secondary | ICD-10-CM | POA: Diagnosis present

## 2023-08-10 LAB — COMPREHENSIVE METABOLIC PANEL WITH GFR
ALT: 23 U/L (ref 0–44)
AST: 35 U/L (ref 15–41)
Albumin: 4.7 g/dL (ref 3.5–5.0)
Alkaline Phosphatase: 52 U/L (ref 38–126)
Anion gap: 14 (ref 5–15)
BUN: 9 mg/dL (ref 6–20)
CO2: 23 mmol/L (ref 22–32)
Calcium: 9.3 mg/dL (ref 8.9–10.3)
Chloride: 98 mmol/L (ref 98–111)
Creatinine, Ser: 0.92 mg/dL (ref 0.44–1.00)
GFR, Estimated: >60 mL/min (ref >60)
Glucose, Bld: 120 mg/dL — ABNORMAL HIGH (ref 70–99)
Potassium: 3.5 mmol/L (ref 3.5–5.1)
Sodium: 135 mmol/L (ref 135–145)
Total Bilirubin: 1 mg/dL (ref 0.0–1.2)
Total Protein: 8.5 g/dL — ABNORMAL HIGH (ref 6.5–8.1)

## 2023-08-10 LAB — CBC WITH DIFFERENTIAL/PLATELET
Abs Immature Granulocytes: 0.02 K/uL (ref 0.00–0.07)
Basophils Absolute: 0 K/uL (ref 0.0–0.1)
Basophils Relative: 0 %
Eosinophils Absolute: 0 K/uL (ref 0.0–0.5)
Eosinophils Relative: 0 %
HCT: 44.5 % (ref 36.0–46.0)
Hemoglobin: 14.7 g/dL (ref 12.0–15.0)
Immature Granulocytes: 0 %
Lymphocytes Relative: 11 %
Lymphs Abs: 0.9 K/uL (ref 0.7–4.0)
MCH: 29.3 pg (ref 26.0–34.0)
MCHC: 33 g/dL (ref 30.0–36.0)
MCV: 88.6 fL (ref 80.0–100.0)
Monocytes Absolute: 0.2 K/uL (ref 0.1–1.0)
Monocytes Relative: 3 %
Neutro Abs: 6.9 K/uL (ref 1.7–7.7)
Neutrophils Relative %: 86 %
Platelets: 375 K/uL (ref 150–400)
RBC: 5.02 MIL/uL (ref 3.87–5.11)
RDW: 12.6 % (ref 11.5–15.5)
WBC: 8 K/uL (ref 4.0–10.5)
nRBC: 0 % (ref 0.0–0.2)

## 2023-08-10 LAB — ETHANOL: Alcohol, Ethyl (B): <15 mg/dL (ref <15)

## 2023-08-10 LAB — HCG, SERUM, QUALITATIVE: Preg, Serum: NEGATIVE

## 2023-08-10 LAB — LIPASE, BLOOD: Lipase: 32 U/L (ref 11–51)

## 2023-08-10 MED ORDER — LACTATED RINGERS IV BOLUS
1000.0000 mL | Freq: Once | INTRAVENOUS | Status: AC
  Administered 2023-08-10: 1000 mL via INTRAVENOUS

## 2023-08-10 MED ORDER — DROPERIDOL 2.5 MG/ML IJ SOLN
1.2500 mg | Freq: Once | INTRAMUSCULAR | Status: AC
  Administered 2023-08-10: 1.25 mg via INTRAVENOUS
  Filled 2023-08-10: qty 2

## 2023-08-10 MED ORDER — FENTANYL CITRATE PF 50 MCG/ML IJ SOSY
25.0000 ug | PREFILLED_SYRINGE | Freq: Once | INTRAMUSCULAR | Status: AC
  Administered 2023-08-10: 25 ug via INTRAVENOUS
  Filled 2023-08-10: qty 1

## 2023-08-10 MED ORDER — ONDANSETRON HCL 4 MG/2ML IJ SOLN
4.0000 mg | Freq: Once | INTRAMUSCULAR | Status: AC
  Administered 2023-08-10: 4 mg via INTRAVENOUS
  Filled 2023-08-10: qty 2

## 2023-08-10 MED ORDER — FAMOTIDINE IN NACL 20-0.9 MG/50ML-% IV SOLN
20.0000 mg | Freq: Once | INTRAVENOUS | Status: AC
  Administered 2023-08-10: 20 mg via INTRAVENOUS
  Filled 2023-08-10: qty 50

## 2023-08-10 MED ORDER — PROCHLORPERAZINE MALEATE 10 MG PO TABS: 10.0000 mg | ORAL_TABLET | Freq: Two times a day (BID) | ORAL | 0 refills | Status: DC | PRN

## 2023-08-10 NOTE — ED Notes (Signed)
 Asked patient if she could provide a urine sample, she said she did not need to go at this time. Will ask again at a later time.

## 2023-08-10 NOTE — Discharge Instructions (Addendum)
 You appear to have a stomach bug. Symptoms typically resolve on their own within the next 2-3 days. Please eat a bland diet with foods such as rice, toast, crackers, and then advance your diet as tolerated. Keep well hydrated at home with water and electrolyte solutions such as Pedialyte.   You have been prescribed Compazine  for nausea and vomiting. You may take this every 12 hours as needed for nausea and vomiting.   Your kidney, liver, and pancreas labs were normal today. Your blood counts and electrolytes were normal.   Follow-up instructions: Please follow-up with your primary care provider for further evaluation of your symptoms if you are not feeling better within the next 5 days.   Return instructions:  Please return to the Emergency Department if you experience worsening symptoms.  RETURN IMMEDIATELY IF you develop shortness of breath, confusion or altered mental status, a new rash, become dizzy, faint, or poorly responsive, or are unable to be cared for at home. Please return if you have persistent vomiting and cannot keep down fluids or develop a fever that is not controlled by tylenol or motrin.   Please return if you have any other emergent concerns.

## 2023-08-10 NOTE — ED Provider Notes (Signed)
 Ate tacos, then started to feel nauseous and reports some vomiting. Awaiting PO challenge Physical Exam  BP 138/87   Pulse 60   Temp 98.7 F (37.1 C) (Oral)   Resp 18   Wt 54 kg   LMP  (LMP Unknown)   SpO2 100%   BMI 21.77 kg/m   Physical Exam Vitals and nursing note reviewed.  Constitutional:      General: She is not in acute distress.    Appearance: Normal appearance.  HENT:     Head: Atraumatic.  Pulmonary:     Effort: Pulmonary effort is normal.  Abdominal:     General: Abdomen is flat. Bowel sounds are normal.     Palpations: Abdomen is soft.     Tenderness: There is abdominal tenderness.     Comments: Mild tenderness diffusely, no rebound or guarding  Neurological:     General: No focal deficit present.     Mental Status: She is alert.  Psychiatric:        Mood and Affect: Mood normal.        Behavior: Behavior normal.     Procedures  Procedures  ED Course / MDM   Clinical Course as of 08/10/23 0927  Thu Aug 10, 2023  0737 EKG checked before giving droperidol . Qtc not prolonged [AF]    Clinical Course User Index [AF] Veta Palma, PA-C   Medical Decision Making Amount and/or Complexity of Data Reviewed Labs: ordered.  Risk Prescription drug management.   Patient accepted at signout.  In short, patient presenting with abdominal cramping, nausea and vomiting that started yesterday afternoon.  States she ate tacos and had an alcoholic drink at [employer], and then shortly after started vomiting.  She denies any fever or chills.  No diarrhea, still passing gas.  Labs are unremarkable with CBC within normal limits.  CMP without any elevation LFTs or creatinine.  Normal electrolytes.  Lipase within normal limits.  Pregnancy negative.  Ethanol level within normal limits.  7 AM: Upon reevaluation, patient still reports some abdominal cramping.  Does have mild abdominal tenderness to palpation diffusely.  No rebound or guarding.  No active vomiting, but  does report continued nausea.  Patient does report smoking marijuana 2 days ago.  States she does not smoke marijuana often.  Since patient is still having nausea after the Zofran  and Pepcid  given earlier by previous team, ordered patient droperidol  after independently reviewing and interpreting EKG which does not have any prolonged QTc.   9:20am: Upon reevaluation after droperidol , patient tolerating sips of water without difficulty.  No further episodes of vomiting.  She reports abdominal pain has improved with improvement in nausea.  Abdomen on re-evaluation is nontender to palpation.  Patient would like to go home at this time.  I have low concern for acute intra-abdominal abnormality given she is afebrile, nontachycardic, no leukocytosis, no elevation creatinine, LFTs, or lipase.  Abdomen not acutely tender to palpation.  She is still passing gas, low concern for SBO. Do not feel she needs CT scan at this time.  Symptoms seem most consistent with gastroenteritis from the food she ate. I feel she is stable and appropriate for discharge home at this time. Return precautions given.        Veta Palma, PA-C 08/10/23 [id number]    Neysa Caron PARAS, DO 08/10/23 1505

## 2023-08-10 NOTE — ED Triage Notes (Signed)
 Pt in with emesis and abdominal pain x 12hrs. Pt states she may have eaten something bad yesterday. Reports stomach cramping, denies any diarrhea or hematemesis

## 2023-08-10 NOTE — ED Provider Notes (Signed)
 [place] EMERGENCY DEPARTMENT AT [facility] Provider Note   CSN: [id number] Arrival date & time: 08/10/23  0255     Patient presents with: Emesis   Judy Daniel is a 23 y.o. female.  Patient without significant medical history presents the emergency department with concerns of vomiting.  Reports that she has had vomiting and generalized abdominal pain for the last 12 hours.  Was initially concern for possible food poisoning after eating food at [employer] and quickly seen after beginning to experience vomiting.  Not tolerating p.o.  No reported diarrhea.  Has had chills but no fevers.  No sick contacts as far she is aware.  Denies any hematochezia, hematemesis, melanotic stools.   Emesis      Prior to Admission medications   Medication Sig Start Date End Date Taking? Authorizing Provider  cefUROXime  (CEFTIN ) 500 MG tablet Take 1 tablet (500 mg total) by mouth 2 (two) times daily with a meal. 06/24/21   Rudy Carlin LABOR, MD  COVID-19 Ad26 vaccine, JANSSEN/J&J, 0.5 ML injection Inject into the muscle. 11/10/20   Luiz Channel, MD  fluconazole  (DIFLUCAN ) 200 MG tablet Take 1 tablet (200 mg total) by mouth every 3 (three) days. 06/24/21   Rudy Carlin LABOR, MD  LO LOESTRIN FE  1 MG-10 MCG / 10 MCG tablet Take 1 tablet by mouth daily. Start taking pill on the first day of period. 06/24/21   Rudy Carlin LABOR, MD  metroNIDAZOLE  (FLAGYL ) 500 MG tablet Take 1 tablet (500 mg total) by mouth 2 (two) times daily. 11/15/21   Vicky Charleston, PA-C  ondansetron  (ZOFRAN ) 4 MG tablet Take 1 tablet (4 mg total) by mouth every 6 (six) hours. 09/25/21   Silver Wonda LABOR, PA  potassium chloride  SA (KLOR-CON  M) 20 MEQ tablet Take 2 tablets (40 mEq total) by mouth 2 (two) times daily for 7 days. 06/13/21 06/20/21  Mesner, Jason, MD    Allergies: Sulfa antibiotics and Sulfanilamide    Review of Systems  Gastrointestinal:  Positive for vomiting.  All other systems reviewed and are  negative.   Updated Vital Signs BP (!) 121/101   Pulse (!) 54   Temp 98.1 F (36.7 C) (Oral)   Resp 18   Wt 54 kg   LMP  (LMP Unknown)   SpO2 100%   BMI 21.77 kg/m   Physical Exam Vitals and nursing note reviewed.  Constitutional:      General: She is not in acute distress.    Appearance: She is well-developed. She is ill-appearing.  HENT:     Head: Normocephalic and atraumatic.     Mouth/Throat:     Mouth: Mucous membranes are dry.  Eyes:     Conjunctiva/sclera: Conjunctivae normal.  Cardiovascular:     Rate and Rhythm: Normal rate and regular rhythm.     Heart sounds: No murmur heard. Pulmonary:     Effort: Pulmonary effort is normal. No respiratory distress.     Breath sounds: Normal breath sounds.  Abdominal:     General: Abdomen is flat. Bowel sounds are normal. There is no distension.     Palpations: Abdomen is soft.     Tenderness: There is abdominal tenderness. There is no guarding.     Comments: Generalized abdominal tenderness.  Musculoskeletal:        General: No swelling.     Cervical back: Neck supple.  Skin:    General: Skin is warm and dry.     Capillary Refill: Capillary refill takes  less than 2 seconds.  Neurological:     Mental Status: She is alert.  Psychiatric:        Mood and Affect: Mood normal.     (all labs ordered are listed, but only abnormal results are displayed) Labs Reviewed  COMPREHENSIVE METABOLIC PANEL WITH GFR - Abnormal; Notable for the following components:      Result Value   Glucose, Bld 120 (*)    Total Protein 8.5 (*)    All other components within normal limits  CBC WITH DIFFERENTIAL/PLATELET  LIPASE, BLOOD  ETHANOL  URINALYSIS, ROUTINE W REFLEX MICROSCOPIC  PREGNANCY, URINE  RAPID URINE DRUG SCREEN, HOSP PERFORMED  HCG, SERUM, QUALITATIVE    EKG: None  Radiology: No results found.   Procedures   Medications Ordered in the ED  lactated ringers  bolus 1,000 mL (0 mLs Intravenous Stopped 08/10/23 0607)   ondansetron  (ZOFRAN ) injection 4 mg (4 mg Intravenous Given 08/10/23 0349)  fentaNYL  (SUBLIMAZE ) injection 25 mcg (25 mcg Intravenous Given 08/10/23 0350)  famotidine  (PEPCID ) IVPB 20 mg premix (0 mg Intravenous Stopped 08/10/23 0607)  ondansetron  (ZOFRAN ) injection 4 mg (4 mg Intravenous Given 08/10/23 0548)  fentaNYL  (SUBLIMAZE ) injection 25 mcg (25 mcg Intravenous Given 08/10/23 9371)                                    Medical Decision Making Amount and/or Complexity of Data Reviewed Labs: ordered.  Risk Prescription drug management.   This patient presents to the ED for concern of vomiting, this involves an extensive number of treatment options, and is a complaint that carries with it a high risk of complications and morbidity.  The differential diagnosis includes gastroenteritis, bowel obstruction,    Co morbidities that complicate the patient evaluation  Bells palsy   Lab Tests:  I Ordered, and personally interpreted labs.  The pertinent results include: CBC unremarkable, CMP with no evidence of AKI or evidence of dehydration, lipase unremarkable at 32, ethanol negative, UA pending, UDS pending urine pregnancy pending   Consultations Obtained:  I requested consultation with none,  and discussed lab and imaging findings as well as pertinent plan - they recommend: N/A   Problem List / ED Course / Critical interventions / Medication management  Patient with history of Bell's palsy presents to the ED with concerns of vomiting.  Patient reports concerns for possible food poisoning after eating a taco while she was at [employer].  States that her symptoms felt about 30 minutes to 1 hour after eating this.  No sick contacts with anyone with similar symptoms.  Abdominal pain present as a cramping type feeling that worsens when she feels the need to vomit.  No diarrhea.  States that she has had some blood streaking in her vomit but denies any obvious hematemesis.  Not currently on blood  thinners.  No heavy NSAID or aspirin use. On exam, patient has diffuse abdominal tenderness with normal bowel sounds.  Delayed capillary refill between 2 to 3 seconds indicating likely mild dehydration.  Dry mucous membranes. Patient's lab workup is unremarkable.  Had improvement with combination of fluids, fentanyl , IV Zofran , and famotidine .  Was tolerating p.o. and sipping on water in the room comfortably.  Attempted to eat crackers which worsens her abdominal pain.  Redosing fentanyl . I ordered medication including fentanyl , fluids, Zofran , Pepcid   for pain, dehydration, nausea  Reevaluation of the patient after these medicines showed that  the patient improved I have reviewed the patients home medicines and have made adjustments as needed   Test / Admission - Considered:  Final disposition per oncoming team.  6:41 AM Care of Keelin Kopf transferred to [facility] and Dr. Neysa at the end of my shift as the patient will require reassessment once labs/imaging have resulted. Patient presentation, ED course, and plan of care discussed with review of all pertinent labs and imaging. Please see his/her note for further details regarding further ED course and disposition. Plan at time of handoff is reassess for PO tolerance. Was previously discharged with Zofran  from urgent care. If not tolerating PO, may consider CT imaging to rule out more concerning cause of abdominal pain/vomiting. This may be altered or completely changed at the discretion of the oncoming team pending results of further workup.   Final diagnoses:  Nausea and vomiting, unspecified vomiting type    ED Discharge Orders     None          Cecily Legrand LABOR, PA-C 08/10/23 9358    Jerral Meth, MD 08/10/23 [phone]

## 2023-09-03 ENCOUNTER — Emergency Department (HOSPITAL_COMMUNITY)
Admission: EM | Admit: 2023-09-03 | Discharge: 2023-09-03 | Disposition: A | Discharge status: Home / Self Care | Attending: Emergency Medicine | Admitting: Emergency Medicine

## 2023-09-03 ENCOUNTER — Encounter (HOSPITAL_COMMUNITY): Payer: Self-pay | Admitting: *Deleted

## 2023-09-03 ENCOUNTER — Other Ambulatory Visit: Payer: Self-pay

## 2023-09-03 DIAGNOSIS — F12288 Cannabis dependence with other cannabis-induced disorder: Secondary | ICD-10-CM | POA: Diagnosis not present

## 2023-09-03 DIAGNOSIS — R1110 Vomiting, unspecified: Secondary | ICD-10-CM | POA: Diagnosis present

## 2023-09-03 DIAGNOSIS — E876 Hypokalemia: Secondary | ICD-10-CM | POA: Diagnosis not present

## 2023-09-03 LAB — CBC WITH DIFFERENTIAL/PLATELET
Abs Immature Granulocytes: 0.03 K/uL (ref 0.00–0.07)
Basophils Absolute: 0 K/uL (ref 0.0–0.1)
Basophils Relative: 0 %
Eosinophils Absolute: 0 K/uL (ref 0.0–0.5)
Eosinophils Relative: 0 %
HCT: 45 % (ref 36.0–46.0)
Hemoglobin: 14.4 g/dL (ref 12.0–15.0)
Immature Granulocytes: 0 %
Lymphocytes Relative: 16 %
Lymphs Abs: 1.4 K/uL (ref 0.7–4.0)
MCH: 29.1 pg (ref 26.0–34.0)
MCHC: 32 g/dL (ref 30.0–36.0)
MCV: 90.9 fL (ref 80.0–100.0)
Monocytes Absolute: 0.5 K/uL (ref 0.1–1.0)
Monocytes Relative: 6 %
Neutro Abs: 6.9 K/uL (ref 1.7–7.7)
Neutrophils Relative %: 78 %
Platelets: 312 K/uL (ref 150–400)
RBC: 4.95 MIL/uL (ref 3.87–5.11)
RDW: 13.1 % (ref 11.5–15.5)
WBC: 9 K/uL (ref 4.0–10.5)
nRBC: 0 % (ref 0.0–0.2)

## 2023-09-03 LAB — COMPREHENSIVE METABOLIC PANEL WITH GFR
ALT: 14 U/L (ref 0–44)
AST: 26 U/L (ref 15–41)
Albumin: 4.5 g/dL (ref 3.5–5.0)
Alkaline Phosphatase: 58 U/L (ref 38–126)
Anion gap: 11 (ref 5–15)
BUN: 9 mg/dL (ref 6–20)
CO2: 24 mmol/L (ref 22–32)
Calcium: 9.8 mg/dL (ref 8.9–10.3)
Chloride: 106 mmol/L (ref 98–111)
Creatinine, Ser: 0.9 mg/dL (ref 0.44–1.00)
GFR, Estimated: >60 mL/min (ref >60)
Glucose, Bld: 135 mg/dL — ABNORMAL HIGH (ref 70–99)
Potassium: 3.3 mmol/L — ABNORMAL LOW (ref 3.5–5.1)
Sodium: 141 mmol/L (ref 135–145)
Total Bilirubin: 0.7 mg/dL (ref 0.0–1.2)
Total Protein: 8.3 g/dL — ABNORMAL HIGH (ref 6.5–8.1)

## 2023-09-03 LAB — LIPASE, BLOOD: Lipase: 44 U/L (ref 11–51)

## 2023-09-03 LAB — HCG, SERUM, QUALITATIVE: Preg, Serum: NEGATIVE

## 2023-09-03 MED ORDER — DROPERIDOL 2.5 MG/ML IJ SOLN
1.2500 mg | Freq: Once | INTRAMUSCULAR | Status: AC
  Administered 2023-09-03: 1.25 mg via INTRAVENOUS
  Filled 2023-09-03: qty 2

## 2023-09-03 MED ORDER — ONDANSETRON HCL 4 MG/2ML IJ SOLN
4.0000 mg | Freq: Once | INTRAMUSCULAR | Status: DC
  Filled 2023-09-03: qty 2

## 2023-09-03 MED ORDER — SODIUM CHLORIDE 0.9 % IV BOLUS
1000.0000 mL | Freq: Once | INTRAVENOUS | Status: AC
  Administered 2023-09-03: 1000 mL via INTRAVENOUS

## 2023-09-03 MED ORDER — FENTANYL CITRATE PF 50 MCG/ML IJ SOSY
25.0000 ug | PREFILLED_SYRINGE | Freq: Once | INTRAMUSCULAR | Status: AC
  Administered 2023-09-03: 25 ug via INTRAVENOUS
  Filled 2023-09-03: qty 1

## 2023-09-03 MED ORDER — PROCHLORPERAZINE MALEATE 10 MG PO TABS
10.0000 mg | ORAL_TABLET | Freq: Two times a day (BID) | ORAL | 0 refills | Status: AC | PRN
Start: 2023-09-03 — End: ?

## 2023-09-03 NOTE — ED Provider Notes (Signed)
 [place] EMERGENCY DEPARTMENT AT [facility] Provider Note   CSN: [id number] Arrival date & time: 09/03/23  1508     Patient presents with: Emesis   Judy Daniel is a 23 y.o. female.  Patient presents emergency department concerns of vomiting.  Reports onset of symptoms about 2 hours ago.  Endorses diffuse abdominal pain and some diarrhea as well.  States last marijuana use was last night.  Has been seen in the emergency department numerous times for symptoms or concerns.  Denies any other substance use.  No medications taken prior to arriving to alleviate her symptoms. Denies hematemesis, hematochezia, or melanotic stools.    Emesis      Prior to Admission medications   Medication Sig Start Date End Date Taking? Authorizing Provider  cefUROXime  (CEFTIN ) 500 MG tablet Take 1 tablet (500 mg total) by mouth 2 (two) times daily with a meal. 06/24/21   Rudy Carlin LABOR, MD  COVID-19 Ad26 vaccine, JANSSEN/J&J, 0.5 ML injection Inject into the muscle. 11/10/20   Luiz Channel, MD  fluconazole  (DIFLUCAN ) 200 MG tablet Take 1 tablet (200 mg total) by mouth every 3 (three) days. 06/24/21   Rudy Carlin LABOR, MD  LO LOESTRIN FE  1 MG-10 MCG / 10 MCG tablet Take 1 tablet by mouth daily. Start taking pill on the first day of period. 06/24/21   Rudy Carlin LABOR, MD  metroNIDAZOLE  (FLAGYL ) 500 MG tablet Take 1 tablet (500 mg total) by mouth 2 (two) times daily. 11/15/21   Vicky Charleston, PA-C  ondansetron  (ZOFRAN ) 4 MG tablet Take 1 tablet (4 mg total) by mouth every 6 (six) hours. 09/25/21   Silver Wonda LABOR, PA  potassium chloride  SA (KLOR-CON  M) 20 MEQ tablet Take 2 tablets (40 mEq total) by mouth 2 (two) times daily for 7 days. 06/13/21 06/20/21  Mesner, Selinda, MD  prochlorperazine  (COMPAZINE ) 10 MG tablet Take 1 tablet (10 mg total) by mouth 2 (two) times daily as needed for nausea or vomiting. 09/03/23   Gracen Ringwald A, PA-C    Allergies: Sulfa antibiotics and  Sulfanilamide    Review of Systems  Gastrointestinal:  Positive for vomiting.  All other systems reviewed and are negative.   Updated Vital Signs BP (!) 97/53 (BP Location: Right Arm)   Pulse (!) 51   Temp 98.5 F (36.9 C) (Oral)   Resp 18   Wt 54 kg   LMP 09/03/2023 (Exact Date)   SpO2 100%   BMI 21.77 kg/m   Physical Exam Vitals and nursing note reviewed.  Constitutional:      General: She is not in acute distress.    Appearance: She is well-developed.     Comments: Uncomfortable  HENT:     Head: Normocephalic and atraumatic.  Eyes:     Conjunctiva/sclera: Conjunctivae normal.  Cardiovascular:     Rate and Rhythm: Normal rate and regular rhythm.     Heart sounds: No murmur heard. Pulmonary:     Effort: Pulmonary effort is normal. No respiratory distress.     Breath sounds: Normal breath sounds.  Abdominal:     Palpations: Abdomen is soft.     Tenderness: There is no abdominal tenderness.  Musculoskeletal:        General: No swelling.     Cervical back: Neck supple.  Skin:    General: Skin is warm and dry.     Capillary Refill: Capillary refill takes less than 2 seconds.  Neurological:     Mental Status: She  is alert.  Psychiatric:        Mood and Affect: Mood normal.     (all labs ordered are listed, but only abnormal results are displayed) Labs Reviewed  COMPREHENSIVE METABOLIC PANEL WITH GFR - Abnormal; Notable for the following components:      Result Value   Potassium 3.3 (*)    Glucose, Bld 135 (*)    Total Protein 8.3 (*)    All other components within normal limits  LIPASE, BLOOD  HCG, SERUM, QUALITATIVE  CBC WITH DIFFERENTIAL/PLATELET  URINALYSIS, ROUTINE W REFLEX MICROSCOPIC    EKG: EKG Interpretation Date/Time:  Sunday September 03 2023 17:23:54 EDT Ventricular Rate:  56 PR Interval:  143 QRS Duration:  73 QT Interval:  442 QTC Calculation: 427 R Axis:   83  Text Interpretation: Sinus rhythm No significant change since last tracing  Confirmed by Patt Alm DEL [phone]) on 09/03/2023 5:26:20 PM  Radiology: No results found.   Procedures   Medications Ordered in the ED  sodium chloride  0.9 % bolus 1,000 mL (1,000 mLs Intravenous New Bag/Given 09/03/23 1607)  droperidol  (INAPSINE ) 2.5 MG/ML injection 1.25 mg (1.25 mg Intravenous Given 09/03/23 1604)  fentaNYL  (SUBLIMAZE ) injection 25 mcg (25 mcg Intravenous Given 09/03/23 1656)                                    Medical Decision Making Amount and/or Complexity of Data Reviewed Labs: ordered.  Risk Prescription drug management.   This patient presents to the ED for concern of vomiting, this involves an extensive number of treatment options, and is a complaint that carries with it a high risk of complications and morbidity.  The differential diagnosis includes gastroparesis, bowel obstruction, AKI, dehydration, subcu disorder, intoxication   Co morbidities that complicate the patient evaluation  Cannabis use history   Additional history obtained:  Additional history obtained from the visit from 08/10/2023   Lab Tests:  I Ordered, and personally interpreted labs.  The pertinent results include: CBC unremarkable, CMP with mild hypokalemia 3.3, lipase -44, hCG negative   Cardiac Monitoring: / EKG:  The patient was maintained on a cardiac monitor.  I personally viewed and interpreted the cardiac monitored which showed an underlying rhythm of: sinus rhythm   Consultations Obtained:  I requested consultation with none,  and discussed lab and imaging findings as well as pertinent plan - they recommend: N/A   Problem List / ED Course / Critical interventions / Medication management  Patient with past history of cannabis use presents emergency department concerns of vomiting.  She reports that she developed nausea today.  Last marijuana use was last night as well as alcohol use.  She states that she has been using alcohol intermittently over the last several weeks.   Was last seen in the emergency department 3 similar symptoms.  At that time, responded to droperidol  at another antiemetics were helpful for her nausea.  Hematemesis, medic easier, melanotic stools. On exam, patient is uncomfortable.  Visible vomit in the bag next to her but not currently dry heaving.  Abdomen is slightly tender but increases due to the straining that she has been experiencing.  High concern for cannabis related vomiting, will give patient.  I will reassess shortly.  Fluid bolus initiated.  She is tachycardic on arrival which is not unexpected given her vomiting.  Will monitor this for resolution/improvement. Basic labs unremarkable hypokalemia potassium 3.3.  Heart rate decreasing down to the 50s secondary to droperidol .  No long endorsing any nausea or pain after fentanyl  administered following the droperidol . I had lengthy discussion with patient about concerns that nausea and vomiting that she continues to experience is mostly likely due to her cannabis use. With no abdominal pain as nausea has resolved, do not feel that she requires emergent imaging. Advised strict return precautions. Otherwise stable to follow up with PCP. Discharged home in stable condition. I ordered medication including fluids, droperidol , fentanyl   for dehydration, nausea, pain  Reevaluation of the patient after these medicines showed that the patient improved I have reviewed the patients home medicines and have made adjustments as needed   Social Determinants of Health:  Cannabis use history   Test / Admission - Considered:  Considered admission but stable for discharge.  Final diagnoses:  Cannabis hyperemesis syndrome concurrent with and due to cannabis dependence [facility])  Hypokalemia    ED Discharge Orders          Ordered    prochlorperazine  (COMPAZINE ) 10 MG tablet  2 times daily PRN        09/03/23 1911               Vonnetta Akey A, PA-C 09/03/23 1913    Patt Alm Macho,  MD 09/03/23 [phone]

## 2023-09-03 NOTE — ED Triage Notes (Addendum)
 Here by POV from home for NV. Onset 2 hrs ago. Last Marijuana use last night. H/o similar and seen in ED 3 weeks ago. No meds PTA. Friend present.

## 2023-09-03 NOTE — ED Notes (Signed)
 Patient d/c with home care instructions. VS obtained. IV discontinued.

## 2023-09-03 NOTE — Discharge Instructions (Addendum)
 You were seen in the ER today for concerns of nausea and vomiting. Your symptoms improved after medications were administered here in the ER. I have concerns that this episode was caused from cannabis use. Please follow up with your primary care provider for further evaluation. Return to the ER for concerns of new or worsening symptoms.
